# Patient Record
Sex: Female | Born: 2000 | Race: Black or African American | Hispanic: No | Marital: Single | Smoking: Never smoker
Health system: Southern US, Community
[De-identification: ages and names within clinical notes are randomized; demographics above are authoritative.]

## PROBLEM LIST (undated history)

## (undated) DIAGNOSIS — J45909 Unspecified asthma, uncomplicated: Secondary | ICD-10-CM

## (undated) DIAGNOSIS — R6521 Severe sepsis with septic shock: Secondary | ICD-10-CM

## (undated) DIAGNOSIS — A419 Sepsis, unspecified organism: Secondary | ICD-10-CM

## (undated) HISTORY — PX: EXTRACORPOREAL CIRCULATION: SHX266

## (undated) HISTORY — PX: CARDIAC SURGERY: SHX584

## (undated) HISTORY — PX: OTHER SURGICAL HISTORY: SHX169

## (undated) HISTORY — PX: WISDOM TOOTH EXTRACTION: SHX21

---
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
## 2019-02-23 ENCOUNTER — Ambulatory Visit: Payer: Medicaid Other | Admitting: Obstetrics and Gynecology

## 2019-02-23 ENCOUNTER — Encounter: Payer: Self-pay | Admitting: Obstetrics and Gynecology

## 2019-02-23 ENCOUNTER — Other Ambulatory Visit: Payer: Self-pay

## 2019-02-23 DIAGNOSIS — Z369 Encounter for antenatal screening, unspecified: Secondary | ICD-10-CM

## 2019-02-23 LAB — OB RESULTS CONSOLE VARICELLA ZOSTER ANTIBODY, IGG: Varicella: UNDETERMINED

## 2019-02-23 NOTE — Progress Notes (Signed)
Neysa Hotter presents for NOB nurse interview visit. Pregnancy confirmation done __12/11/2018 at [facility]. G1. P0. Pregnancy education material explained and given. 0 cats in home. NOB labs ordered. TSH/HbgA1c ordered due to BMI 30 or greater. Sickle cell ordered due to patient's race. HIV labs and drug screen were explained and ordered. PNV encouraged. Genetic screening options discussed. Genetic testing:Unsure. Patient may discuss with the provider. Patient to follow up with provider in _1-2___ weeks for NOB physical. All questions answered. Ready set baby has been gone over.

## 2019-02-25 ENCOUNTER — Other Ambulatory Visit: Payer: Medicaid Other

## 2019-02-25 LAB — VARICELLA ZOSTER ANTIBODY, IGG: Varicella zoster IgG: 137 index — ABNORMAL LOW (ref >165)

## 2019-02-25 LAB — HIV ANTIBODY (ROUTINE TESTING W REFLEX): HIV Screen 4th Generation wRfx: NONREACTIVE

## 2019-02-25 LAB — HEMOGLOBIN A1C
Est. average glucose Bld gHb Est-mCnc: 100 mg/dL
Hgb A1c MFr Bld: 5.1 % (ref 4.8–5.6)

## 2019-02-25 LAB — RPR: RPR Ser Ql: NONREACTIVE

## 2019-02-25 LAB — HGB SOLU + RFLX FRAC: Sickle Solubility Test - HGBRFX: NEGATIVE

## 2019-02-25 LAB — ABO AND RH: Rh Factor: POSITIVE

## 2019-02-25 LAB — RUBELLA SCREEN: Rubella Antibodies, IGG: 5.37 index (ref >0.99)

## 2019-02-25 LAB — HEPATITIS B SURFACE ANTIGEN: Hepatitis B Surface Ag: NEGATIVE

## 2019-02-25 LAB — ANTIBODY SCREEN: Antibody Screen: NEGATIVE

## 2019-02-25 LAB — TSH: TSH: 0.019 u[IU]/mL — ABNORMAL LOW (ref 0.450–4.500)

## 2019-02-26 NOTE — L&D Delivery Note (Signed)
Delivery Summary for Briana Cruz  Labor Events:   Preterm labor: No data found  Rupture date: 08/27/2019  Rupture time: 8:00 PM  Rupture type: Spontaneous  Fluid Color: Clear  Induction: No data found  Augmentation: No data found  Complications: No data found  Cervical ripening: No data found No data found   No data found     Delivery:   Episiotomy: No data found  Lacerations: No data found  Repair suture: No data found  Repair # of packets: No data found  Blood loss (ml): 820   Information for the patient's newborn:  Lissy, Deuser [[id number]]    Delivery 08/28/2019 4:40 AM by  Vaginal, Spontaneous Sex:  female Gestational Age: [redacted]w[redacted]d Delivery Clinician:   Living?:         APGARS  One minute Five minutes Ten minutes  Skin color:        Heart rate:        Grimace:        Muscle tone:        Breathing:        Totals: 8  9      Presentation/position:      Resuscitation:   Cord information:    Disposition of cord blood:     Blood gases sent?  Complications:   Placenta: Delivered:       appearance Newborn Measurements: Weight: 5 lb 3.3 oz (2360 g)  Height: 18.25"  Head circumference:    Chest circumference:    Other providers:    Additional  information: Forceps:   Vacuum:   Breech:   Observed anomalies mongolian spots buttocks; dimpled chin       Delivery Note At 4:40 AM a viable and healthy female was delivered via Vaginal, Spontaneous (Presentation: Vertex; Left Occiput Anterior).  APGAR: 8, 9; weight 5 lb 3.3 oz (2360 g).   Placenta status: Spontaneous, Intact.  Cord: 3 vessels with the following complications: None.  Cord pH: not obtained.    Anesthesia: Epidural Episiotomy: None Lacerations: None Suture Repair: None Est. Blood Loss (mL): 820. Cytotec 800 mcg administered PR. 1 dose of Methergine 0.2 mg IM administered.  Mom to postpartum.  Baby to Couplet care / Skin to Skin.   Hildred Laser, MD 08/27/2019,  5: 16 AM

## 2019-03-02 ENCOUNTER — Other Ambulatory Visit: Payer: Self-pay

## 2019-03-02 ENCOUNTER — Ambulatory Visit (INDEPENDENT_AMBULATORY_CARE_PROVIDER_SITE_OTHER): Payer: Self-pay

## 2019-03-02 DIAGNOSIS — Z369 Encounter for antenatal screening, unspecified: Secondary | ICD-10-CM

## 2019-03-02 DIAGNOSIS — Z3A11 11 weeks gestation of pregnancy: Secondary | ICD-10-CM

## 2019-03-02 DIAGNOSIS — N8312 Corpus luteum cyst of left ovary: Secondary | ICD-10-CM

## 2019-03-02 DIAGNOSIS — O3481 Maternal care for other abnormalities of pelvic organs, first trimester: Secondary | ICD-10-CM

## 2019-03-10 ENCOUNTER — Ambulatory Visit (INDEPENDENT_AMBULATORY_CARE_PROVIDER_SITE_OTHER): Payer: Medicaid Other | Admitting: Obstetrics and Gynecology

## 2019-03-10 ENCOUNTER — Encounter: Payer: Self-pay | Admitting: Obstetrics and Gynecology

## 2019-03-10 ENCOUNTER — Other Ambulatory Visit: Payer: Self-pay

## 2019-03-10 VITALS — BP 112/74 | HR 88 | Wt 240.5 lb

## 2019-03-10 DIAGNOSIS — E0590 Thyrotoxicosis, unspecified without thyrotoxic crisis or storm: Secondary | ICD-10-CM

## 2019-03-10 DIAGNOSIS — Z369 Encounter for antenatal screening, unspecified: Secondary | ICD-10-CM

## 2019-03-10 DIAGNOSIS — Z3A13 13 weeks gestation of pregnancy: Secondary | ICD-10-CM

## 2019-03-10 DIAGNOSIS — O99281 Endocrine, nutritional and metabolic diseases complicating pregnancy, first trimester: Secondary | ICD-10-CM

## 2019-03-10 DIAGNOSIS — Z86718 Personal history of other venous thrombosis and embolism: Secondary | ICD-10-CM

## 2019-03-10 DIAGNOSIS — Z9281 Personal history of extracorporeal membrane oxygenation (ECMO): Secondary | ICD-10-CM

## 2019-03-10 LAB — POCT URINALYSIS DIPSTICK OB
Bilirubin, UA: NEGATIVE
Glucose, UA: NEGATIVE
Nitrite, UA: NEGATIVE
Spec Grav, UA: 1.01 (ref 1.010–1.025)
Urobilinogen, UA: 0.2 E.U./dL
pH, UA: 6.5 (ref 5.0–8.0)

## 2019-03-10 MED ORDER — ENOXAPARIN SODIUM 40 MG/0.4ML ~~LOC~~ SOLN: 40.0000 mg | SUBCUTANEOUS | 0 refills | Status: DC

## 2019-03-10 NOTE — Progress Notes (Signed)
HPI:      [name] is a 19 y.o. G1P0 who LMP was Patient's last menstrual period was 12/04/2018.  Subjective:   She presents today to initiate prenatal care.  She has a number of medical issues. 3-1/2 years ago she underwent a procedure for wisdom tooth extraction and ended up with multiple face and tongue abscesses, cardiac issues requiring ECMO, subsequent blood clot issues requiring use of Lovenox, patient describes a complete loss of functioning and having to learn to speak and walk again afterward.  She states that she believes she is completely cleared cardiac wise and does not have a cardiologist at this time. She is taking prenatal vitamins. Screening labs reveal possibility of hyperthyroidism.    Hx: The following portions of the patient's history were reviewed and updated as appropriate:             She  has a past medical history of Asthma and Septic shock ([place]). She does not have a problem list on file. She  has a past surgical history that includes Extracorporeal membrane oxygenation; Cardiac surgery; Wisdom tooth extraction; and OTHER SURGICAL HISTORY. Her family history is not on file. She  reports that she has never smoked. She has never used smokeless tobacco. She reports that she does not drink alcohol or use drugs. She has a current medication list which includes the following prescription(s): prenatal multivitamin and enoxaparin. She is allergic to penicillins.       Review of Systems:  Review of Systems  Constitutional: Denied constitutional symptoms, night sweats, recent illness, fatigue, fever, insomnia and weight loss.  Eyes: Denied eye symptoms, eye pain, photophobia, vision change and visual disturbance.  Ears/Nose/Throat/Neck: Denied ear, nose, throat or neck symptoms, hearing loss, nasal discharge, sinus congestion and sore throat.  Cardiovascular: Denied cardiovascular symptoms, arrhythmia, chest pain/pressure, edema, exercise intolerance, orthopnea  and palpitations.  Respiratory: Denied pulmonary symptoms, asthma, pleuritic pain, productive sputum, cough, dyspnea and wheezing.  Gastrointestinal: Denied, gastro-esophageal reflux, melena, nausea and vomiting.  Genitourinary: Denied genitourinary symptoms including symptomatic vaginal discharge, pelvic relaxation issues, and urinary complaints.  Musculoskeletal: Denied musculoskeletal symptoms, stiffness, swelling, muscle weakness and myalgia.  Dermatologic: Denied dermatology symptoms, rash and scar.  Neurologic: Denied neurology symptoms, dizziness, headache, neck pain and syncope.  Psychiatric: Denied psychiatric symptoms, anxiety and depression.  Endocrine: Denied endocrine symptoms including hot flashes and night sweats.   Meds:   Current Outpatient Medications on File Prior to Visit  Medication Sig Dispense Refill  . Prenatal Vit-Fe Fumarate-FA (PRENATAL MULTIVITAMIN) TABS tablet Take 1 tablet by mouth daily at 12 noon.     No current facility-administered medications on file prior to visit.    Objective:     Vitals:   03/10/19 1342  BP: 112/74  Pulse: 88    She is probably Physical examination General NAD, Conversant  HEENT Atraumatic; Op clear with mmm.  Normo-cephalic. Pupils reactive. Anicteric sclerae  Thyroid/Neck Smooth without nodularity or enlargement. Normal ROM.  Neck Supple.  Skin No rashes, lesions or ulceration. Normal palpated skin turgor. No nodularity.  Breasts: No masses or discharge.  Symmetric.  No axillary adenopathy.  Lungs: Clear to auscultation.No rales or wheezes. Normal Respiratory effort, no retractions.  Heart: NSR.  No murmurs or rubs appreciated. No periferal edema  Abdomen: Soft.  Non-tender.  No masses.  No HSM. No hernia  Extremities: Moves all appropriately.  Normal ROM for age. No lymphadenopathy.  Neuro: Oriented to PPT.  Normal mood. Normal affect.  Pelvic:   Vulva: Normal appearance.  No lesions.  Vagina: No lesions or  abnormalities noted.  Support: Normal pelvic support.  Urethra No masses tenderness or scarring.  Meatus Normal size without lesions or prolapse.  Cervix: Normal appearance.  No lesions.  Anus: Normal exam.  No lesions.  Perineum: Normal exam.  No lesions.        Bimanual   Adnexae: No masses.  Non-tender to palpation.  Uterus: Enlarged. 13wks  Non-tender.  Mobile.  AV.  Adnexae: No masses.  Non-tender to palpation.  Cul-de-sac: Negative for abnormality.  Adnexae: No masses.  Non-tender to palpation.         Pelvimetry   Diagonal: Reached.  Spines: Average.  Sacrum: Concave.  Pubic Arch: Normal.      Assessment:    G1P0 There are no problems to display for this patient.    1. Prenatal screening encounter   2. Hyperthyroidism   3. History of blood clots   4. Personal history of ECMO        Plan:            Prenatal Plan 1.  Begin Lovenox for anticoagulation prophylaxis 2.  Anesthesia consult regarding cardiac history and mouth facial fasciitis issues 3.  Continue prenatal vitamins 4.  Patient desires maternity 21 today 5.  Thyroid panel 6.  Patient to obtain cardiac clearance from her cardiologist in light of her previous ECMO   Orders Orders Placed This Encounter  Procedures  . MaterniT21 PLUS Core+ESS+SCA  . Thyroid Panel With TSH  . POC Urinalysis Dipstick OB     Meds ordered this encounter  Medications  . enoxaparin (LOVENOX) 40 MG/0.4ML injection    Sig: Inject 0.4 mLs (40 mg total) into the skin daily.    Dispense:  36 mL    Refill:  0      F/U  Return in about 4 weeks (around 04/07/2019) for [facility]. I spent 35 minutes involved in the care of this patient preparing to see the patient by obtaining and reviewing her medical history (including labs, imaging tests and prior procedures), documenting clinical information in the electronic health record (EHR), writing and sending prescriptions, ordering tests or procedures and directly communicating  with the patient discussing pertinent items from her history and physical exam as well as my assessment and plan as noted above.  All of her questions were answered.  Elonda Husky, M.D. 03/10/2019 2:17 PM

## 2019-03-11 LAB — THYROID PANEL WITH TSH
Free Thyroxine Index: 2.6 (ref 1.2–4.9)
T3 Uptake Ratio: 23 % — ABNORMAL LOW (ref 24–39)
T4, Total: 11.3 ug/dL (ref 4.5–12.0)
TSH: 0.137 u[IU]/mL — ABNORMAL LOW (ref 0.450–4.500)

## 2019-03-15 LAB — MATERNIT21  PLUS CORE+ESS+SCA, BLOOD

## 2019-03-26 ENCOUNTER — Telehealth: Payer: Self-pay

## 2019-03-26 NOTE — Telephone Encounter (Signed)
Pt called no answer LM via VM to call the office to speak more about her concerns for calling the office.  

## 2019-03-26 NOTE — Telephone Encounter (Signed)
OB -2nd trimester  Small amount of blood on TP this morning. NO pelvic pain.  POS FM.  Pls advise

## 2019-04-07 ENCOUNTER — Ambulatory Visit (INDEPENDENT_AMBULATORY_CARE_PROVIDER_SITE_OTHER): Payer: Medicaid Other | Admitting: Obstetrics and Gynecology

## 2019-04-07 ENCOUNTER — Encounter: Payer: Self-pay | Admitting: Obstetrics and Gynecology

## 2019-04-07 ENCOUNTER — Other Ambulatory Visit: Payer: Self-pay

## 2019-04-07 VITALS — BP 91/59 | HR 105 | Ht 63.0 in | Wt 240.7 lb

## 2019-04-07 DIAGNOSIS — O99210 Obesity complicating pregnancy, unspecified trimester: Secondary | ICD-10-CM

## 2019-04-07 DIAGNOSIS — O09899 Supervision of other high risk pregnancies, unspecified trimester: Secondary | ICD-10-CM

## 2019-04-07 DIAGNOSIS — Z283 Underimmunization status: Secondary | ICD-10-CM

## 2019-04-07 DIAGNOSIS — O0992 Supervision of high risk pregnancy, unspecified, second trimester: Secondary | ICD-10-CM

## 2019-04-07 DIAGNOSIS — Z6841 Body Mass Index (BMI) 40.0 and over, adult: Secondary | ICD-10-CM

## 2019-04-07 DIAGNOSIS — Z86718 Personal history of other venous thrombosis and embolism: Secondary | ICD-10-CM

## 2019-04-07 DIAGNOSIS — R946 Abnormal results of thyroid function studies: Secondary | ICD-10-CM

## 2019-04-07 DIAGNOSIS — Z9281 Personal history of extracorporeal membrane oxygenation (ECMO): Secondary | ICD-10-CM

## 2019-04-07 HISTORY — DX: Morbid (severe) obesity due to excess calories: E66.01

## 2019-04-07 HISTORY — DX: Body Mass Index (BMI) 40.0 and over, adult: Z684

## 2019-04-07 LAB — POCT URINALYSIS DIPSTICK OB
Bilirubin, UA: NEGATIVE
Blood, UA: NEGATIVE
Glucose, UA: NEGATIVE
Ketones, UA: NEGATIVE
Nitrite, UA: NEGATIVE
Spec Grav, UA: 1.015 (ref 1.010–1.025)
Urobilinogen, UA: 0.2 E.U./dL
pH, UA: 7.5 (ref 5.0–8.0)

## 2019-04-07 NOTE — Patient Instructions (Signed)

## 2019-04-07 NOTE — Progress Notes (Signed)
ROB: Patient denies complaints. Mom present for today's visit as they have questions regarding current plan of care and diagnoses.  Answered all questions regarding patient's initiation of Lovenox for DVT prophylaxis due to her h/o blood clot, need for Cardiology consult due to h/o ECMO, need for Anesthesia consult, and recent abnormal thyroid levels.  Thyroid levels starting to improve, may pregnancy hormone related. Will recheck again at 20 weeks. S/p normal MaterniT21, will have AFP performed next visit. RTC in 4 weeks, for anatomy scan at that time. Declines flu vaccine. Is varicella non-immune, will need vaccination postpartum.

## 2019-04-07 NOTE — Progress Notes (Signed)
ROB-Pt present for routine prenatal care. Pt declined flu vaccine. Pt stated having lower back pain.

## 2019-04-30 ENCOUNTER — Telehealth: Payer: Self-pay | Admitting: Obstetrics and Gynecology

## 2019-04-30 NOTE — Telephone Encounter (Signed)
Pts mother called in and stated that she is being reckless and that she wants someone to talk to the pt. The pts mom worried about the baby, and the mom is requesting a call back. Please advise.

## 2019-05-03 NOTE — Telephone Encounter (Signed)
Spoke with patients mother about her concerns for her daughter. Mother is on [place]. Mother is concerned that daughter is depressed. Mother stated that daughter has been through a lot. She is concerned that daughter is not taking care of herself or the baby. She is not sure if daughter is still taking her Lovenox injections. I told her that I can give her a depression screening when she comes in for appointment. Mother does not want her daughter to know that she called.

## 2019-05-05 ENCOUNTER — Other Ambulatory Visit: Payer: Medicaid Other

## 2019-05-05 ENCOUNTER — Ambulatory Visit (INDEPENDENT_AMBULATORY_CARE_PROVIDER_SITE_OTHER): Payer: Medicaid Other | Admitting: Obstetrics and Gynecology

## 2019-05-05 ENCOUNTER — Other Ambulatory Visit: Payer: Self-pay

## 2019-05-05 ENCOUNTER — Encounter: Payer: Self-pay | Admitting: Obstetrics and Gynecology

## 2019-05-05 ENCOUNTER — Ambulatory Visit (INDEPENDENT_AMBULATORY_CARE_PROVIDER_SITE_OTHER): Payer: Medicaid Other

## 2019-05-05 ENCOUNTER — Encounter: Payer: Medicaid Other | Admitting: Obstetrics and Gynecology

## 2019-05-05 VITALS — BP 107/70 | HR 90 | Wt 249.2 lb

## 2019-05-05 DIAGNOSIS — Z3A20 20 weeks gestation of pregnancy: Secondary | ICD-10-CM

## 2019-05-05 DIAGNOSIS — O0992 Supervision of high risk pregnancy, unspecified, second trimester: Secondary | ICD-10-CM

## 2019-05-05 DIAGNOSIS — Z363 Encounter for antenatal screening for malformations: Secondary | ICD-10-CM

## 2019-05-05 DIAGNOSIS — O99212 Obesity complicating pregnancy, second trimester: Secondary | ICD-10-CM | POA: Diagnosis not present

## 2019-05-05 DIAGNOSIS — Z9281 Personal history of extracorporeal membrane oxygenation (ECMO): Secondary | ICD-10-CM

## 2019-05-05 LAB — POCT URINALYSIS DIPSTICK OB
Bilirubin, UA: NEGATIVE
Blood, UA: NEGATIVE
Glucose, UA: NEGATIVE
Ketones, UA: NEGATIVE
Nitrite, UA: NEGATIVE
Spec Grav, UA: 1.01 (ref 1.010–1.025)
Urobilinogen, UA: 0.2 E.U./dL
pH, UA: 7 (ref 5.0–8.0)

## 2019-05-05 NOTE — Progress Notes (Signed)
ROB: Thyroid panel drawn today.  Patient did not return cardiology phone calls for consult.  I have stressed the importance of this and given her the name and number of the cardiologist to contact for scheduling appointments.  She states that she is using Lovenox as directed.  Anesthesia consult ordered.  FAS ultrasound today but limited by patient body habitus.  Patient will have to return.  Reports feeling fetal movement.

## 2019-05-07 LAB — AFP, SERUM, OPEN SPINA BIFIDA
AFP MoM: 1.87
AFP Value: 89.5 ng/mL
Gest. Age on Collection Date: 20.1 weeks
Maternal Age At EDD: 20.7 yr
OSBR Risk 1 IN: 2166
Test Results:: NEGATIVE
Weight: 240 [lb_av]

## 2019-05-07 LAB — THYROID PANEL WITH TSH
Free Thyroxine Index: 1.8 (ref 1.2–4.9)
T3 Uptake Ratio: 13 % — ABNORMAL LOW (ref 24–39)
T4, Total: 13.7 ug/dL — ABNORMAL HIGH (ref 4.5–12.0)
TSH: 1.54 u[IU]/mL (ref 0.450–4.500)

## 2019-05-11 ENCOUNTER — Ambulatory Visit: Payer: Self-pay | Admitting: Cardiovascular Disease

## 2019-05-14 ENCOUNTER — Other Ambulatory Visit: Payer: Self-pay | Admitting: Obstetrics and Gynecology

## 2019-05-14 DIAGNOSIS — Z09 Encounter for follow-up examination after completed treatment for conditions other than malignant neoplasm: Secondary | ICD-10-CM

## 2019-05-18 ENCOUNTER — Ambulatory Visit (INDEPENDENT_AMBULATORY_CARE_PROVIDER_SITE_OTHER): Payer: Medicaid Other | Admitting: Cardiovascular Disease

## 2019-05-18 ENCOUNTER — Encounter: Payer: Self-pay | Admitting: Cardiovascular Disease

## 2019-05-18 ENCOUNTER — Other Ambulatory Visit: Payer: Self-pay

## 2019-05-18 VITALS — BP 110/72 | HR 100 | Ht 64.0 in | Wt 254.4 lb

## 2019-05-18 DIAGNOSIS — Z9281 Personal history of extracorporeal membrane oxygenation (ECMO): Secondary | ICD-10-CM

## 2019-05-18 DIAGNOSIS — Z86711 Personal history of pulmonary embolism: Secondary | ICD-10-CM

## 2019-05-18 DIAGNOSIS — Z86718 Personal history of other venous thrombosis and embolism: Secondary | ICD-10-CM | POA: Diagnosis not present

## 2019-05-18 DIAGNOSIS — Z3A22 22 weeks gestation of pregnancy: Secondary | ICD-10-CM

## 2019-05-18 NOTE — Progress Notes (Signed)
Cardiology Office Note  Date:  05/18/2019   ID:  Briana Cruz, DOB 2000/09/11, MRN [id number]  [facility]P:  [facility] [place]   Chief Complaint  Patient presents with  . New Patient (Initial Visit)    Ref by Dr. Jeannie Cruz ob/gyn for evaluation for anesthesia consult & a history of blood clots; pt. is [redacted] weeks pregnant. Meds reviewed by the pt. verbally. 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  DVT and PE complications Discharge from hospital June 15 2014 and was in the hospital 46 days Who presents by referral from Dr. Amalia Cruz for consultation of her cardiac issues, use of anticoagulation  Reports that she is [redacted] weeks pregnant Started On lovenox , started by Dr. Marcelline Cruz given prior history of DVT .  She is on low-dose prophylactic dosing  Over the past 4 to 5 years has been active, no recurrent DVT or PE Did not have any thromboembolic issues prior to 8416 Discussed details with her mother on the phone today  Currently works , [employer] On her feet for most of the shift, no problems with swelling, shortness of breath or chest discomfort  Echo 04/2014: normal study Results pulled up and reviewed  Lab work reviewed, normal renal function  EKG personally reviewed by myself on todays visit Shows normal sinus rhythm rate 100 bpm no significant ST or T wave changes   PMH:   has a past medical history of Asthma and Septic shock ([place]).  PSH:    Past Surgical History:  Procedure Laterality Date  . CARDIAC SURGERY    . EXTRACORPOREAL CIRCULATION    . OTHER SURGICAL HISTORY     Facial drain  . WISDOM TOOTH EXTRACTION      Current Outpatient Medications  Medication Sig Dispense Refill  . albuterol (PROAIR HFA) 108 (90 Base) MCG/ACT inhaler Inhale 1-2 puffs into the lungs every 4 (four) hours as  needed.     . enoxaparin (LOVENOX) 40 MG/0.4ML injection Inject 0.4 mLs (40 mg total) into the skin daily. 36 mL 0  . Prenatal Vit-Fe Fumarate-FA (PRENATAL MULTIVITAMIN) TABS tablet Take 1 tablet by mouth daily at 12 noon.     No current facility-administered medications for this visit.     Allergies:   Penicillins   Social History:  The patient  reports that she has never smoked. She has never used smokeless tobacco. She reports previous drug use. Drug: Marijuana. She reports that she does not drink alcohol.   Family History:   family history includes Congestive Heart Failure in her maternal grandmother; Diabetes in her maternal grandfather and maternal grandmother; Gout in her maternal grandfather; Healthy in her brother, father, and mother; Hypertension in her maternal grandmother; Kidney disease in her maternal grandmother.    Review of Systems: Review of Systems  Constitutional: Negative.   HENT: Negative.   Respiratory: Negative.   Cardiovascular: Negative.   Gastrointestinal: Negative.   Musculoskeletal: Negative.   Neurological: Negative.   Psychiatric/Behavioral: Negative.   All other systems reviewed and are negative.    PHYSICAL EXAM: VS:  BP 110/72 (BP Location: Right Arm, Patient Position: Sitting, Cuff Size: Normal)   Pulse 100   Ht 5\' 4"  (1.626 m)   Wt 254 lb 6 oz (115.4 kg)   LMP 12/04/2018   SpO2 98%  BMI 43.66 kg/m  , BMI Body mass index is 43.66 kg/m. GEN: Well nourished, well developed, in no acute distress obese HEENT: normal Neck: no JVD, carotid bruits, or masses Cardiac: RRR; no murmurs, rubs, or gallops,no edema  Respiratory:  clear to auscultation bilaterally, normal work of breathing GI: soft, nontender, nondistended, + BS MS: no deformity or atrophy Skin: warm and dry, no rash, well-healed mediastinal scar Neuro:  Strength and sensation are intact Psych: euthymic mood, full affect   Recent Labs: 05/05/2019: TSH 1.540    Lipid  Panel No results found for: CHOL, HDL, LDLCALC, TRIG    Wt Readings from Last 3 Encounters:  05/18/19 254 lb 6 oz (115.4 kg)  05/05/19 249 lb 3.2 oz (113 kg)  04/07/19 240 lb 11.2 oz (109.2 kg)       ASSESSMENT AND PLAN:  Problem List Items Addressed This Visit    History of blood clots - Primary   Relevant Orders   EKG 12-Lead   Personal history of ECMO    Other Visit Diagnoses    History of pulmonary embolism       [redacted] weeks gestation of pregnancy       History of DVT (deep vein thrombosis)         Complicated hospital course and 2015 extending into 2016 Essentially bedridden, fighting infection in the hospital 46 days ECMO required at that time Developed DVT, PE, treated with Lovenox, stopped in summer 2016 Has not had any recurrence of her thromboembolic disease No kidney issues,  Has been active, no significant medical issues past 4 to 5 years following recovery -Prior cardiac studies reviewed including echocardiogram 2016, normal March 2016 -No further cardiac work-up needed -OB/GYN has started prophylactic dose Lovenox given prior history She is not sedentary, works full-time job at [employer] place, on her feet for long hours at a time -No further cardiac work-up needed Lab work reviewed from 2015 and 2016, on my review, there was little concern for genetic predisposition for thromboembolic disease No further work-up needed  Disposition:   F/U as needed   Total encounter time more than 60 minutes  Greater than 50% was spent in counseling and coordination of care with the patient    Signed, Briana Cruz, M.D., Ph.D. [facility] [place], [place] [phone]

## 2019-05-18 NOTE — Patient Instructions (Signed)

## 2019-05-19 ENCOUNTER — Ambulatory Visit (INDEPENDENT_AMBULATORY_CARE_PROVIDER_SITE_OTHER): Payer: Medicaid Other

## 2019-05-19 DIAGNOSIS — Z3A22 22 weeks gestation of pregnancy: Secondary | ICD-10-CM

## 2019-05-19 DIAGNOSIS — Z3689 Encounter for other specified antenatal screening: Secondary | ICD-10-CM | POA: Diagnosis not present

## 2019-05-19 DIAGNOSIS — Z09 Encounter for follow-up examination after completed treatment for conditions other than malignant neoplasm: Secondary | ICD-10-CM

## 2019-05-20 ENCOUNTER — Encounter: Payer: Self-pay | Admitting: Cardiovascular Disease

## 2019-05-23 ENCOUNTER — Other Ambulatory Visit: Payer: Self-pay | Admitting: Obstetrics and Gynecology

## 2019-05-23 DIAGNOSIS — Z86718 Personal history of other venous thrombosis and embolism: Secondary | ICD-10-CM

## 2019-05-24 NOTE — Telephone Encounter (Signed)
Please advise on refill.

## 2019-06-02 ENCOUNTER — Encounter: Payer: Self-pay | Admitting: Obstetrics and Gynecology

## 2019-06-02 ENCOUNTER — Other Ambulatory Visit: Payer: Self-pay

## 2019-06-02 ENCOUNTER — Ambulatory Visit (INDEPENDENT_AMBULATORY_CARE_PROVIDER_SITE_OTHER): Payer: Medicaid Other | Admitting: Obstetrics and Gynecology

## 2019-06-02 VITALS — BP 96/66 | HR 103 | Wt 254.4 lb

## 2019-06-02 DIAGNOSIS — Z130 Encounter for screening for diseases of the blood and blood-forming organs and certain disorders involving the immune mechanism: Secondary | ICD-10-CM

## 2019-06-02 DIAGNOSIS — O0992 Supervision of high risk pregnancy, unspecified, second trimester: Secondary | ICD-10-CM

## 2019-06-02 DIAGNOSIS — Z3A24 24 weeks gestation of pregnancy: Secondary | ICD-10-CM | POA: Diagnosis not present

## 2019-06-02 DIAGNOSIS — Z86718 Personal history of other venous thrombosis and embolism: Secondary | ICD-10-CM

## 2019-06-02 DIAGNOSIS — Z131 Encounter for screening for diabetes mellitus: Secondary | ICD-10-CM

## 2019-06-02 LAB — POCT URINALYSIS DIPSTICK OB
Bilirubin, UA: NEGATIVE
Blood, UA: NEGATIVE
Glucose, UA: NEGATIVE
Ketones, UA: NEGATIVE
Nitrite, UA: NEGATIVE
POC,PROTEIN,UA: NEGATIVE
Spec Grav, UA: 1.01 (ref 1.010–1.025)
Urobilinogen, UA: 0.2 E.U./dL
pH, UA: 7.5 (ref 5.0–8.0)

## 2019-06-02 MED ORDER — ENOXAPARIN SODIUM 40 MG/0.4ML ~~LOC~~ SOLN: SUBCUTANEOUS | 3 refills | Status: DC

## 2019-06-02 MED ORDER — ALBUTEROL SULFATE HFA 108 (90 BASE) MCG/ACT IN AERS: 1.0000 | INHALATION_SPRAY | RESPIRATORY_TRACT | 1 refills | Status: DC | PRN

## 2019-06-02 NOTE — Patient Instructions (Signed)
GESTATIONAL DIABETES TESTING FOR PREGNANCY  Pregnant women can develop a condition known as Gestational Diabetes (diabetes brought on by pregnancy) which can pose a risk to both mother and baby. A glucose tolerance test is a common type of testing for potential gestational diabetes.  There are several tests intended to identify gestational diabetes in pregnant women. The first, called the Glucose Challenge Screening, is a preliminary screening test performed between 26-28 weeks. If a woman tests positive during this screening test, the second test, called the Glucose Tolerance Test, may be performed. This test will diagnose whether diabetes exists or not by indicating whether or not the body is using glucose (a type of sugar) effectively.  The Glucose Challenge Screening is now considered to be a standard test performed during the early part of the third trimester of pregnancy.  What is the Glucose Challenge Screening Test? No preparation is required prior to the test. During the test, the mother is asked to drink a sweet liquid (glucose) and then will have blood drawn one hour from having the drink, as blood glucose levels normally peak within one hour. No fasting is required prior to this test.  The test evaluates how your body processes sugar. A high level in your blood may indicate your body is not processing sugar effectively (positive test). If the results of this screen are positive, the woman may have the Glucose Tolerance Test performed. It is important to note that not all women who test positive for the Glucose Challenge Screening test are found to have diabetes upon further diagnosis.  What is the Glucose Tolerance Test? Prior to the taking the glucose tolerance test, your doctor will ask you to make sure and eat at least 150mg of carbohydrates (about what you will get from a slice or two of bread) for three days prior to the time you will be asked to fast. You will not be permitted to eat  or drink anything but sips of water for 14 hours prior to the test, so it is best to schedule the test for first thing in the morning.  Additionally, you should plan to have someone drive you to and from the test since your energy levels may be low and there is a slight possibility you may feel light-headed.  When you arrive, the technician will draw blood to measure your baseline "fasting blood glucose level". You will be asked to drink a larger volume (or more concentrated solution) of the glucose drink than was used in the initial Glucose Challenge Screening test. Your blood will be drawn and tested every hour for the next three hours.  The following are the values that the American Diabetes Association considers to be abnormal during the Glucose Tolerance Test:  Interval Abnormal reading Fasting 95 mg/dl or higher One hour 180 mg/dl or higher Two hours 155 mg/dl or higher Three hours 140 mg/dl or higher  What if my Glucose Tolerance Test Results are Abnormal? If only one of your readings comes back abnormal, your doctor may suggest some changes to your diet and/or test you again later in the pregnancy. If two or more of your readings come back abnormal, you'll be diagnosed with Gestational Diabetes and your doctor or midwife will talk to you about a treatment plan. Treating diabetes during pregnancy is extremely important to protect the health of both mother and baby.   Compiled using information from the following sources:  1. American Diabetes Association  https://www.diabetes.org  2. Emedicine  https://www.emedicine.com    3. National Institute of Diabetes and Digestive and Kidney Diseases  

## 2019-06-02 NOTE — Progress Notes (Signed)
ROB-Pt present for routine prenatal care. Pt stated that she was doing well no problems.  

## 2019-06-02 NOTE — Progress Notes (Signed)
ROB: Patient denies complaints today. Requests refill on Lovenox, also desires refill of Albuterol inhaler (notes no worsening of symptoms, just uses it to help if she has to walk a lot). S/p Cardiology consult, no further workup needed. Still needs Anesthesia consult. RTC in 4 weeks, for 28 week labs at that time.    The following were addressed during this visit:  Breastfeeding Education - Early initiation of breastfeeding    Comments: Keeps milk supply adequate, helps contract uterus and slow bleeding, and early milk is the perfect first food and is easy to digest.   - The importance of exclusive breastfeeding    Comments: Provides antibodies, Lower risk of breast and ovarian cancers, and type-2 diabetes,Helps your body recover, Reduced chance of SIDS.   - Risks of giving your baby anything other than breast milk if you are breastfeeding    Comments: Make the baby less content with breastfeeds, may make my baby more susceptible to illness, and may reduce my milk supply.   - The importance of early skin-to-skin contact    Comments: Keeps baby warm and secure, helps keep baby's blood sugar up and breathing steady, easier to bond and breastfeed, and helps calm baby.  - Exclusive breastfeeding for the first 6 months    Comments: Builds a healthy milk supply and keeps it up, protects baby from sickness and disease, and breastmilk has everything your baby needs for the first 6 months.

## 2019-06-29 ENCOUNTER — Encounter
Admission: RE | Admit: 2019-06-29 | Discharge: 2019-06-29 | Disposition: A | Discharge status: Home / Self Care | Payer: Medicaid Other | Source: Ambulatory Visit | Attending: Anesthesiology | Admitting: Anesthesiology

## 2019-06-29 ENCOUNTER — Other Ambulatory Visit: Payer: Self-pay

## 2019-06-29 NOTE — Consult Note (Signed)
[facility] Anesthesia Consultation  Briana Cruz KNL:[id number] DOB: 2000-07-17 DOA: 06/29/2019 PCP: [facility]   Requesting physician: Dr. Valentino Saxon Date of consultation: 06/29/19 Reason for consultation: Hx of chest infection requiring ECMO, hx of DVT  CHIEF COMPLAINT:  Obesity during pregnancy  HISTORY OF PRESENT ILLNESS: Briana Cruz  is a 18 y.o. female with a known history of chest infection at age 58 following wisdom tooth extraction, she was on ECMO at that time. No cardiac problems at this time. Has followed up with cardiology during her pregnancy with no concerns. Most recent echo was normal. She also has a hx of DVT and is currently on lovenox. She does have mild intermittent asthma. Denies personal or family hx of bleeding disorders.   PAST MEDICAL HISTORY:   Past Medical History:  Diagnosis Date  . Asthma   . Septic shock (HCC)     PAST SURGICAL HISTORY:  Past Surgical History:  Procedure Laterality Date  . CARDIAC SURGERY    . EXTRACORPOREAL CIRCULATION    . OTHER SURGICAL HISTORY     Facial drain  . WISDOM TOOTH EXTRACTION      SOCIAL HISTORY:  Social History   Tobacco Use  . Smoking status: Never Smoker  . Smokeless tobacco: Never Used  Substance Use Topics  . Alcohol use: No    FAMILY HISTORY:  Family History  Problem Relation Age of Onset  . Healthy Mother   . Healthy Father   . Healthy Brother   . Congestive Heart Failure Maternal Grandmother   . Kidney disease Maternal Grandmother   . Diabetes Maternal Grandmother   . Hypertension Maternal Grandmother   . Diabetes Maternal Grandfather   . Gout Maternal Grandfather     DRUG ALLERGIES:  Allergies  Allergen Reactions  . Penicillins     REVIEW OF SYSTEMS:   RESPIRATORY: No cough, shortness of breath, wheezing.  CARDIOVASCULAR: No chest pain, orthopnea, edema.  HEMATOLOGY: No anemia, easy bruising or bleeding SKIN: No rash or  lesion. NEUROLOGIC: No tingling, numbness, weakness.  PSYCHIATRY: No anxiety or depression.   MEDICATIONS AT HOME:  Prior to Admission medications   Medication Sig Start Date End Date Taking? Authorizing Provider  albuterol (PROAIR HFA) 108 (90 Base) MCG/ACT inhaler Inhale 1-2 puffs into the lungs every 4 (four) hours as needed. 06/02/19   Hildred Laser, MD  enoxaparin (LOVENOX) 40 MG/0.4ML injection INJECT 0.4 ML UNDER THE SKIN ONCE DAILY 06/02/19   Hildred Laser, MD  Prenatal Vit-Fe Fumarate-FA (PRENATAL MULTIVITAMIN) TABS tablet Take 1 tablet by mouth daily at 12 noon.    [provider]      PHYSICAL EXAMINATION:   VITAL SIGNS: Last menstrual period 12/04/2018.  GENERAL:  19 y.o.-year-old patient no acute distress.  HEENT: Head atraumatic, normocephalic.  EXTREMITIES: No pedal edema, cyanosis, or clubbing.  NEUROLOGIC: normal gait PSYCHIATRIC: The patient is alert and oriented x 3.  SKIN: No obvious rash, lesion, or ulcer.    IMPRESSION AND PLAN:   Briana Cruz  is a 19 y.o. female presenting with hx of chest infection requiring ECMO and hx of DVT currently on lovenox.   No concerns from a cardiac standpoint.   Discussed with patient that she will likely be transitioned to heparin at some point during pregnancy. Discussed that use of anticoagulants does put some constraints on timing of epidural placement. Plan to followup with her OB provider to discuss what dose of heparin is planned so that both the  patient and the anesthesia care team can have a plan in place for timing of epidural placement.

## 2019-06-30 ENCOUNTER — Encounter: Payer: Medicaid Other | Admitting: Obstetrics and Gynecology

## 2019-06-30 ENCOUNTER — Other Ambulatory Visit: Payer: Medicaid Other

## 2019-07-02 ENCOUNTER — Other Ambulatory Visit: Payer: Medicaid Other

## 2019-07-02 ENCOUNTER — Encounter: Payer: Medicaid Other | Admitting: Obstetrics and Gynecology

## 2019-07-06 ENCOUNTER — Other Ambulatory Visit: Payer: Self-pay

## 2019-07-06 ENCOUNTER — Other Ambulatory Visit: Payer: Medicaid Other

## 2019-07-06 ENCOUNTER — Encounter: Payer: Self-pay | Admitting: Obstetrics and Gynecology

## 2019-07-06 ENCOUNTER — Ambulatory Visit (INDEPENDENT_AMBULATORY_CARE_PROVIDER_SITE_OTHER): Payer: Medicaid Other | Admitting: Obstetrics and Gynecology

## 2019-07-06 VITALS — BP 102/70 | HR 90 | Wt 251.5 lb

## 2019-07-06 DIAGNOSIS — Z131 Encounter for screening for diabetes mellitus: Secondary | ICD-10-CM

## 2019-07-06 DIAGNOSIS — Z130 Encounter for screening for diseases of the blood and blood-forming organs and certain disorders involving the immune mechanism: Secondary | ICD-10-CM

## 2019-07-06 DIAGNOSIS — Z3A29 29 weeks gestation of pregnancy: Secondary | ICD-10-CM

## 2019-07-06 DIAGNOSIS — O0992 Supervision of high risk pregnancy, unspecified, second trimester: Secondary | ICD-10-CM

## 2019-07-06 DIAGNOSIS — Z86718 Personal history of other venous thrombosis and embolism: Secondary | ICD-10-CM

## 2019-07-06 DIAGNOSIS — O0993 Supervision of high risk pregnancy, unspecified, third trimester: Secondary | ICD-10-CM | POA: Diagnosis not present

## 2019-07-06 DIAGNOSIS — Z23 Encounter for immunization: Secondary | ICD-10-CM | POA: Diagnosis not present

## 2019-07-06 LAB — POCT URINALYSIS DIPSTICK OB
Bilirubin, UA: NEGATIVE
Blood, UA: NEGATIVE
Glucose, UA: NEGATIVE
Leukocytes, UA: NEGATIVE
Nitrite, UA: NEGATIVE
Spec Grav, UA: 1.01 (ref 1.010–1.025)
Urobilinogen, UA: 0.2 E.U./dL
pH, UA: 7 (ref 5.0–8.0)

## 2019-07-06 NOTE — Progress Notes (Signed)
ROB: No complaints.  Reports very active baby.  Anesthesia consult discussed and reviewed with patient in light of cardiology consult.  Dr. Valentino Saxon and Dr. Logan Bores to put together a plan regarding timing of delivery and use of Lovenox etc.  1 hour GCT today.

## 2019-07-06 NOTE — Addendum Note (Signed)
Addended by: Dorian Pod on: 07/06/2019 03:18 PM   Modules accepted: Orders

## 2019-07-06 NOTE — Addendum Note (Signed)
Addended by: Dorian Pod on: 07/06/2019 09:08 AM   Modules accepted: Orders

## 2019-07-07 LAB — CBC
Hematocrit: 31.2 % — ABNORMAL LOW (ref 34.0–46.6)
Hemoglobin: 10.6 g/dL — ABNORMAL LOW (ref 11.1–15.9)
MCH: 30.8 pg (ref 26.6–33.0)
MCHC: 34 g/dL (ref 31.5–35.7)
MCV: 91 fL (ref 79–97)
Platelets: 207 10*3/uL (ref 150–450)
RBC: 3.44 x10E6/uL — ABNORMAL LOW (ref 3.77–5.28)
RDW: 12.5 % (ref 11.7–15.4)
WBC: 8 10*3/uL (ref 3.4–10.8)

## 2019-07-07 LAB — GLUCOSE, 1 HOUR GESTATIONAL: Gestational Diabetes Screen: 114 mg/dL (ref 65–139)

## 2019-07-07 LAB — RPR: RPR Ser Ql: NONREACTIVE

## 2019-07-08 ENCOUNTER — Other Ambulatory Visit: Payer: Self-pay | Admitting: Obstetrics and Gynecology

## 2019-07-08 MED ORDER — FERROUS SULFATE 325 (65 FE) MG PO TABS: 325.0000 mg | ORAL_TABLET | Freq: Every day | ORAL | 1 refills | Status: DC

## 2019-07-13 ENCOUNTER — Telehealth: Payer: Self-pay | Admitting: Obstetrics and Gynecology

## 2019-07-13 NOTE — Telephone Encounter (Signed)
Patient says medication that was prescribed to her is making her sick. Asked patient what medication it would be and she didn't remember the name of it. Could you please advise?

## 2019-07-14 NOTE — Telephone Encounter (Signed)
LM for patient to return call.

## 2019-07-16 NOTE — Telephone Encounter (Signed)
LM for patient to return call.

## 2019-07-16 NOTE — Telephone Encounter (Signed)
Pt was returning your call. Please advise

## 2019-07-16 NOTE — Telephone Encounter (Signed)
Spoke with patient and she has gotten sick two times when she took her iron tablet. I told her to try taking them with food to see if that helps. She will call back if she has any problems.

## 2019-07-27 NOTE — Patient Instructions (Signed)
Third Trimester of Pregnancy  The third trimester is from week 28 through week 40 (months 7 through 9). This trimester is when your unborn baby (fetus) is growing very fast. At the end of the ninth month, the unborn baby is about 20 inches in length. It weighs about 6-10 pounds. Follow these instructions at home: Medicines  Take over-the-counter and prescription medicines only as told by your doctor. Some medicines are safe and some medicines are not safe during pregnancy.  Take a prenatal vitamin that contains at least 600 micrograms (mcg) of folic acid.  If you have trouble pooping (constipation), take medicine that will make your stool soft (stool softener) if your doctor approves. Eating and drinking   Eat regular, healthy meals.  Avoid raw meat and uncooked cheese.  If you get low calcium from the food you eat, talk to your doctor about taking a daily calcium supplement.  Eat four or five small meals rather than three large meals a day.  Avoid foods that are high in fat and sugars, such as fried and sweet foods.  To prevent constipation: ? Eat foods that are high in fiber, like fresh fruits and vegetables, whole grains, and beans. ? Drink enough fluids to keep your pee (urine) clear or pale yellow. Activity  Exercise only as told by your doctor. Stop exercising if you start to have cramps.  Avoid heavy lifting, wear low heels, and sit up straight.  Do not exercise if it is too hot, too humid, or if you are in a place of great height (high altitude).  You may continue to have sex unless your doctor tells you not to. Relieving pain and discomfort  Wear a good support bra if your breasts are tender.  Take frequent breaks and rest with your legs raised if you have leg cramps or low back pain.  Take warm water baths (sitz baths) to soothe pain or discomfort caused by hemorrhoids. Use hemorrhoid cream if your doctor approves.  If you develop puffy, bulging veins (varicose  veins) in your legs: ? Wear support hose or compression stockings as told by your doctor. ? Raise (elevate) your feet for 15 minutes, 3-4 times a day. ? Limit salt in your food. Safety  Wear your seat belt when driving.  Make a list of emergency phone numbers, including numbers for family, friends, the hospital, and police and fire departments. Preparing for your baby's arrival To prepare for the arrival of your baby:  Take prenatal classes.  Practice driving to the hospital.  Visit the hospital and tour the maternity area.  Talk to your work about taking leave once the baby comes.  Pack your hospital bag.  Prepare the baby's room.  Go to your doctor visits.  Buy a rear-facing car seat. Learn how to install it in your car. General instructions  Do not use hot tubs, steam rooms, or saunas.  Do not use any products that contain nicotine or tobacco, such as cigarettes and e-cigarettes. If you need help quitting, ask your doctor.  Do not drink alcohol.  Do not douche or use tampons or scented sanitary pads.  Do not cross your legs for long periods of time.  Do not travel for long distances unless you must. Only do so if your doctor says it is okay.  Visit your dentist if you have not gone during your pregnancy. Use a soft toothbrush to brush your teeth. Be gentle when you floss.  Avoid cat litter boxes and soil  used by cats. These carry germs that can cause birth defects in the baby and can cause a loss of your baby (miscarriage) or stillbirth.  Keep all your prenatal visits as told by your doctor. This is important. Contact a doctor if:  You are not sure if you are in labor or if your water has broken.  You are dizzy.  You have mild cramps or pressure in your lower belly.  You have a nagging pain in your belly area.  You continue to feel sick to your stomach, you throw up, or you have watery poop.  You have bad smelling fluid coming from your vagina.  You have  pain when you pee. Get help right away if:  You have a fever.  You are leaking fluid from your vagina.  You are spotting or bleeding from your vagina.  You have severe belly cramps or pain.  You lose or gain weight quickly.  You have trouble catching your breath and have chest pain.  You notice sudden or extreme puffiness (swelling) of your face, hands, ankles, feet, or legs.  You have not felt the baby move in over an hour.  You have severe headaches that do not go away with medicine.  You have trouble seeing.  You are leaking, or you are having a gush of fluid, from your vagina before you are 37 weeks.  You have regular belly spasms (contractions) before you are 37 weeks. Summary  The third trimester is from week 28 through week 40 (months 7 through 9). This time is when your unborn baby is growing very fast.  Follow your doctor's advice about medicine, food, and activity.  Get ready for the arrival of your baby by taking prenatal classes, getting all the baby items ready, preparing the baby's room, and visiting your doctor to be checked.  Get help right away if you are bleeding from your vagina, or you have chest pain and trouble catching your breath, or if you have not felt your baby move in over an hour. This information is not intended to replace advice given to you by your health care provider. Make sure you discuss any questions you have with your health care provider. Document Revised: 06/04/2018 Document Reviewed: 03/19/2016 Elsevier Patient Education  [place]. Common Medications Safe in Pregnancy  Acne:      Constipation:  Benzoyl Peroxide     Colace  Clindamycin      Dulcolax Suppository  Topica Erythromycin     Fibercon  Salicylic Acid      Metamucil         Miralax AVOID:        Senakot   Accutane    Cough:  Retin-A       Cough Drops  Tetracycline      Phenergan w/ Codeine if Rx  Minocycline      Robitussin (Plain &  DM)  Antibiotics:     Crabs/Lice:  Ceclor       RID  Cephalosporins    AVOID:  E-Mycins      Kwell  Keflex  Macrobid/Macrodantin   Diarrhea:  Penicillin      Kao-Pectate  Zithromax      Imodium AD         PUSH FLUIDS AVOID:       Cipro     Fever:  Tetracycline      Tylenol (Regular or Extra  Minocycline       Strength)  Levaquin  Extra Strength-Do not          Exceed 8 tabs/24 hrs Caffeine:        <200mg/day (equiv. To 1 cup of coffee or  approx. 3 12 oz sodas)         Gas: Cold/Hayfever:       Gas-X  Benadryl      Mylicon  Claritin       Phazyme  **Claritin-D        Chlor-Trimeton    Headaches:  Dimetapp      ASA-Free Excedrin  Drixoral-Non-Drowsy     Cold Compress  Mucinex (Guaifenasin)     Tylenol (Regular or Extra  Sudafed/Sudafed-12 Hour     Strength)  **Sudafed PE Pseudoephedrine   Tylenol Cold & Sinus     Vicks Vapor Rub  Zyrtec  **AVOID if Problems With Blood Pressure         Heartburn: Avoid lying down for at least 1 hour after meals  Aciphex      Maalox     Rash:  Milk of Magnesia     Benadryl    Mylanta       1% Hydrocortisone Cream  Pepcid  Pepcid Complete   Sleep Aids:  Prevacid      Ambien   Prilosec       Benadryl  Rolaids       Chamomile Tea  Tums (Limit 4/day)     Unisom  Zantac       Tylenol PM         Warm milk-add vanilla or  Hemorrhoids:       Sugar for taste  Anusol/Anusol H.C.  (RX: Analapram 2.5%)  Sugar Substitutes:  Hydrocortisone OTC     Ok in moderation  Preparation H      Tucks        Vaseline lotion applied to tissue with wiping    Herpes:     Throat:  Acyclovir      Oragel  Famvir  Valtrex     Vaccines:         Flu Shot Leg Cramps:       *Gardasil  Benadryl      Hepatitis A         Hepatitis B Nasal Spray:       Pneumovax  Saline Nasal Spray     Polio Booster         Tetanus Nausea:       Tuberculosis test or PPD  Vitamin B6 25 mg TID   AVOID:    Dramamine      *Gardasil  Emetrol       Live  Poliovirus  Ginger Root 250 mg QID    MMR (measles, mumps &  High Complex Carbs @ Bedtime    rebella)  Sea Bands-Accupressure    Varicella (Chickenpox)  Unisom 1/2 tab TID     *No known complications           If received before Pain:         Known pregnancy;   Darvocet       Resume series after  Lortab        Delivery  Percocet    Yeast:   Tramadol      Femstat  Tylenol 3      Gyne-lotrimin  Ultram       Monistat  Vicodin           MISC:           All Sunscreens           Hair Coloring/highlights          Insect Repellant's          (Including DEET)         Mystic Tans Breastfeeding  Choosing to breastfeed is one of the best decisions you can make for yourself and your baby. A change in hormones during pregnancy causes your breasts to make breast milk in your milk-producing glands. Hormones prevent breast milk from being released before your baby is born. They also prompt milk flow after birth. Once breastfeeding has begun, thoughts of your baby, as well as his or her sucking or crying, can stimulate the release of milk from your milk-producing glands. Benefits of breastfeeding Research shows that breastfeeding offers many health benefits for infants and mothers. It also offers a cost-free and convenient way to feed your baby. For your baby  Your first milk (colostrum) helps your baby's digestive system to function better.  Special cells in your milk (antibodies) help your baby to fight off infections.  Breastfed babies are less likely to develop asthma, allergies, obesity, or type 2 diabetes. They are also at lower risk for sudden infant death syndrome (SIDS).  Nutrients in breast milk are better able to meet your baby's needs compared to infant formula.  Breast milk improves your baby's brain development. For you  Breastfeeding helps to create a very special bond between you and your baby.  Breastfeeding is convenient. Breast milk costs nothing and is always available at the  correct temperature.  Breastfeeding helps to burn calories. It helps you to lose the weight that you gained during pregnancy.  Breastfeeding makes your uterus return faster to its size before pregnancy. It also slows bleeding (lochia) after you give birth.  Breastfeeding helps to lower your risk of developing type 2 diabetes, osteoporosis, rheumatoid arthritis, cardiovascular disease, and breast, ovarian, uterine, and endometrial cancer later in life. Breastfeeding basics Starting breastfeeding  Find a comfortable place to sit or lie down, with your neck and back well-supported.  Place a pillow or a rolled-up blanket under your baby to bring him or her to the level of your breast (if you are seated). Nursing pillows are specially designed to help support your arms and your baby while you breastfeed.  Make sure that your baby's tummy (abdomen) is facing your abdomen.  Gently massage your breast. With your fingertips, massage from the outer edges of your breast inward toward the nipple. This encourages milk flow. If your milk flows slowly, you may need to continue this action during the feeding.  Support your breast with 4 fingers underneath and your thumb above your nipple (make the letter "C" with your hand). Make sure your fingers are well away from your nipple and your baby's mouth.  Stroke your baby's lips gently with your finger or nipple.  When your baby's mouth is open wide enough, quickly bring your baby to your breast, placing your entire nipple and as much of the areola as possible into your baby's mouth. The areola is the colored area around your nipple. ? More areola should be visible above your baby's upper lip than below the lower lip. ? Your baby's lips should be opened and extended outward (flanged) to ensure an adequate, comfortable latch. ? Your baby's tongue should be between his or her lower gum and your breast.  Make sure that your baby's mouth is correctly positioned  around your nipple (latched). Your  baby's lips should create a seal on your breast and be turned out (everted).  It is common for your baby to suck about 2-3 minutes in order to start the flow of breast milk. Latching Teaching your baby how to latch onto your breast properly is very important. An improper latch can cause nipple pain, decreased milk supply, and poor weight gain in your baby. Also, if your baby is not latched onto your nipple properly, he or she may swallow some air during feeding. This can make your baby fussy. Burping your baby when you switch breasts during the feeding can help to get rid of the air. However, teaching your baby to latch on properly is still the best way to prevent fussiness from swallowing air while breastfeeding. Signs that your baby has successfully latched onto your nipple  Silent tugging or silent sucking, without causing you pain. Infant's lips should be extended outward (flanged).  Swallowing heard between every 3-4 sucks once your milk has started to flow (after your let-down milk reflex occurs).  Muscle movement above and in front of his or her ears while sucking. Signs that your baby has not successfully latched onto your nipple  Sucking sounds or smacking sounds from your baby while breastfeeding.  Nipple pain. If you think your baby has not latched on correctly, slip your finger into the corner of your baby's mouth to break the suction and place it between your baby's gums. Attempt to start breastfeeding again. Signs of successful breastfeeding Signs from your baby  Your baby will gradually decrease the number of sucks or will completely stop sucking.  Your baby will fall asleep.  Your baby's body will relax.  Your baby will retain a small amount of milk in his or her mouth.  Your baby will let go of your breast by himself or herself. Signs from you  Breasts that have increased in firmness, weight, and size 1-3 hours after  feeding.  Breasts that are softer immediately after breastfeeding.  Increased milk volume, as well as a change in milk consistency and color by the fifth day of breastfeeding.  Nipples that are not sore, cracked, or bleeding. Signs that your baby is getting enough milk  Wetting at least 1-2 diapers during the first 24 hours after birth.  Wetting at least 5-6 diapers every 24 hours for the first week after birth. The urine should be clear or pale yellow by the age of 5 days.  Wetting 6-8 diapers every 24 hours as your baby continues to grow and develop.  At least 3 stools in a 24-hour period by the age of 5 days. The stool should be soft and yellow.  At least 3 stools in a 24-hour period by the age of 7 days. The stool should be seedy and yellow.  No loss of weight greater than 10% of birth weight during the first 3 days of life.  Average weight gain of 4-7 oz (113-198 g) per week after the age of 4 days.  Consistent daily weight gain by the age of 5 days, without weight loss after the age of 2 weeks. After a feeding, your baby may spit up a small amount of milk. This is normal. Breastfeeding frequency and duration Frequent feeding will help you make more milk and can prevent sore nipples and extremely full breasts (breast engorgement). Breastfeed when you feel the need to reduce the fullness of your breasts or when your baby shows signs of hunger. This is called "breastfeeding on   demand." Signs that your baby is hungry include:  Increased alertness, activity, or restlessness.  Movement of the head from side to side.  Opening of the mouth when the corner of the mouth or cheek is stroked (rooting).  Increased sucking sounds, smacking lips, cooing, sighing, or squeaking.  Hand-to-mouth movements and sucking on fingers or hands.  Fussing or crying. Avoid introducing a pacifier to your baby in the first 4-6 weeks after your baby is born. After this time, you may choose to use a  pacifier. Research has shown that pacifier use during the first year of a baby's life decreases the risk of sudden infant death syndrome (SIDS). Allow your baby to feed on each breast as long as he or she wants. When your baby unlatches or falls asleep while feeding from the first breast, offer the second breast. Because newborns are often sleepy in the first few weeks of life, you may need to awaken your baby to get him or her to feed. Breastfeeding times will vary from baby to baby. However, the following rules can serve as a guide to help you make sure that your baby is properly fed:  Newborns (babies 4 weeks of age or younger) may breastfeed every 1-3 hours.  Newborns should not go without breastfeeding for longer than 3 hours during the day or 5 hours during the night.  You should breastfeed your baby a minimum of 8 times in a 24-hour period. Breast milk pumping     Pumping and storing breast milk allows you to make sure that your baby is exclusively fed your breast milk, even at times when you are unable to breastfeed. This is especially important if you go back to work while you are still breastfeeding, or if you are not able to be present during feedings. Your lactation consultant can help you find a method of pumping that works best for you and give you guidelines about how long it is safe to store breast milk. Caring for your breasts while you breastfeed Nipples can become dry, cracked, and sore while breastfeeding. The following recommendations can help keep your breasts moisturized and healthy:  Avoid using soap on your nipples.  Wear a supportive bra designed especially for nursing. Avoid wearing underwire-style bras or extremely tight bras (sports bras).  Air-dry your nipples for 3-4 minutes after each feeding.  Use only cotton bra pads to absorb leaked breast milk. Leaking of breast milk between feedings is normal.  Use lanolin on your nipples after breastfeeding. Lanolin  helps to maintain your skin's normal moisture barrier. Pure lanolin is not harmful (not toxic) to your baby. You may also hand express a few drops of breast milk and gently massage that milk into your nipples and allow the milk to air-dry. In the first few weeks after giving birth, some women experience breast engorgement. Engorgement can make your breasts feel heavy, warm, and tender to the touch. Engorgement peaks within 3-5 days after you give birth. The following recommendations can help to ease engorgement:  Completely empty your breasts while breastfeeding or pumping. You may want to start by applying warm, moist heat (in the shower or with warm, water-soaked hand towels) just before feeding or pumping. This increases circulation and helps the milk flow. If your baby does not completely empty your breasts while breastfeeding, pump any extra milk after he or she is finished.  Apply ice packs to your breasts immediately after breastfeeding or pumping, unless this is too uncomfortable for you. To   do this: ? Put ice in a plastic bag. ? Place a towel between your skin and the bag. ? Leave the ice on for 20 minutes, 2-3 times a day.  Make sure that your baby is latched on and positioned properly while breastfeeding. If engorgement persists after 48 hours of following these recommendations, contact your health care provider or a Science writer. Overall health care recommendations while breastfeeding  Eat 3 healthy meals and 3 snacks every day. Well-nourished mothers who are breastfeeding need an additional 450-500 calories a day. You can meet this requirement by increasing the amount of a balanced diet that you eat.  Drink enough water to keep your urine pale yellow or clear.  Rest often, relax, and continue to take your prenatal vitamins to prevent fatigue, stress, and low vitamin and mineral levels in your body (nutrient deficiencies).  Do not use any products that contain nicotine or  tobacco, such as cigarettes and e-cigarettes. Your baby may be harmed by chemicals from cigarettes that pass into breast milk and exposure to secondhand smoke. If you need help quitting, ask your health care provider.  Avoid alcohol.  Do not use illegal drugs or marijuana.  Talk with your health care provider before taking any medicines. These include over-the-counter and prescription medicines as well as vitamins and herbal supplements. Some medicines that may be harmful to your baby can pass through breast milk.  It is possible to become pregnant while breastfeeding. If birth control is desired, ask your health care provider about options that will be safe while breastfeeding your baby. Where to find more information: [employer]: www.llli.org Contact a health care provider if:  You feel like you want to stop breastfeeding or have become frustrated with breastfeeding.  Your nipples are cracked or bleeding.  Your breasts are red, tender, or warm.  You have: ? Painful breasts or nipples. ? A swollen area on either breast. ? A fever or chills. ? Nausea or vomiting. ? Drainage other than breast milk from your nipples.  Your breasts do not become full before feedings by the fifth day after you give birth.  You feel sad and depressed.  Your baby is: ? Too sleepy to eat well. ? Having trouble sleeping. ? More than 3 week old and wetting fewer than 6 diapers in a 24-hour period. ? Not gaining weight by 61 days of age.  Your baby has fewer than 3 stools in a 24-hour period.  Your baby's skin or the white parts of his or her eyes become yellow. Get help right away if:  Your baby is overly tired (lethargic) and does not want to wake up and feed.  Your baby develops an unexplained fever. Summary  Breastfeeding offers many health benefits for infant and mothers.  Try to breastfeed your infant when he or she shows early signs of hunger.  Gently tickle or stroke  your baby's lips with your finger or nipple to allow the baby to open his or her mouth. Bring the baby to your breast. Make sure that much of the areola is in your baby's mouth. Offer one side and burp the baby before you offer the other side.  Talk with your health care provider or lactation consultant if you have questions or you face problems as you breastfeed. This information is not intended to replace advice given to you by your health care provider. Make sure you discuss any questions you have with your health care provider. Document Revised: 05/08/2017  Document Reviewed: 03/15/2016 Elsevier Patient Education  [employer].

## 2019-07-27 NOTE — Progress Notes (Signed)
ROB-Pt present for routine prenatal care. Pt stated having braxton hick contractions off and on. No other issues at this time.

## 2019-07-28 ENCOUNTER — Encounter: Payer: Self-pay | Admitting: Obstetrics and Gynecology

## 2019-07-28 ENCOUNTER — Other Ambulatory Visit: Payer: Self-pay

## 2019-07-28 ENCOUNTER — Ambulatory Visit (INDEPENDENT_AMBULATORY_CARE_PROVIDER_SITE_OTHER): Payer: Medicaid Other | Admitting: Obstetrics and Gynecology

## 2019-07-28 VITALS — BP 93/62 | HR 100 | Wt 248.9 lb

## 2019-07-28 DIAGNOSIS — Z86718 Personal history of other venous thrombosis and embolism: Secondary | ICD-10-CM

## 2019-07-28 DIAGNOSIS — Z3A32 32 weeks gestation of pregnancy: Secondary | ICD-10-CM | POA: Diagnosis not present

## 2019-07-28 DIAGNOSIS — Z7901 Long term (current) use of anticoagulants: Secondary | ICD-10-CM

## 2019-07-28 DIAGNOSIS — O479 False labor, unspecified: Secondary | ICD-10-CM

## 2019-07-28 DIAGNOSIS — O0993 Supervision of high risk pregnancy, unspecified, third trimester: Secondary | ICD-10-CM

## 2019-07-28 DIAGNOSIS — O99210 Obesity complicating pregnancy, unspecified trimester: Secondary | ICD-10-CM

## 2019-07-28 LAB — POCT URINALYSIS DIPSTICK OB
Bilirubin, UA: NEGATIVE
Blood, UA: NEGATIVE
Glucose, UA: NEGATIVE
Nitrite, UA: NEGATIVE
Spec Grav, UA: 1.02 (ref 1.010–1.025)
Urobilinogen, UA: 0.2 E.U./dL
pH, UA: 7 (ref 5.0–8.0)

## 2019-07-28 NOTE — Progress Notes (Signed)
ROB: Patient noting some Briana Cruz.  Asks about when next ultrasound will be. For growth scan at 36 weeks, and to begin antenatal testing at that time. Discussion had on delivery plan, will induce at 39 weeks (also due to BMI), to discontinue Lovenox 24 hrs prior to induction date. Does not need to continue in labor. Is considering breastfeeding. Considering Nexplanon for contraception but also willing to consider Mirena IUD. RTC in 2 weeks.

## 2019-08-11 ENCOUNTER — Other Ambulatory Visit: Payer: Self-pay

## 2019-08-11 ENCOUNTER — Ambulatory Visit (INDEPENDENT_AMBULATORY_CARE_PROVIDER_SITE_OTHER): Payer: Medicaid Other | Admitting: Obstetrics and Gynecology

## 2019-08-11 ENCOUNTER — Encounter: Payer: Self-pay | Admitting: Obstetrics and Gynecology

## 2019-08-11 VITALS — BP 90/59 | HR 82 | Wt 250.7 lb

## 2019-08-11 DIAGNOSIS — Z3A34 34 weeks gestation of pregnancy: Secondary | ICD-10-CM

## 2019-08-11 DIAGNOSIS — O0993 Supervision of high risk pregnancy, unspecified, third trimester: Secondary | ICD-10-CM

## 2019-08-11 LAB — POCT URINALYSIS DIPSTICK OB
Bilirubin, UA: NEGATIVE
Blood, UA: NEGATIVE
Glucose, UA: NEGATIVE
Ketones, UA: NEGATIVE
Nitrite, UA: NEGATIVE
Odor: NEGATIVE
POC,PROTEIN,UA: NEGATIVE
Spec Grav, UA: 1.02 (ref 1.010–1.025)
Urobilinogen, UA: 0.2 E.U./dL
pH, UA: 7.5 (ref 5.0–8.0)

## 2019-08-11 NOTE — Progress Notes (Signed)
ROB- Pt desires to stop working at this time. She is up on her feet. Working in [employer].

## 2019-08-11 NOTE — Progress Notes (Signed)
ROB: Patient doing well.  Reports daily movement.  Using Lovenox as directed.  Patient getting more "tired" at work but will work for 2-3 more weeks.  Delivery after 39 weeks again discussed.  Cultures next visit.

## 2019-08-20 ENCOUNTER — Other Ambulatory Visit: Payer: Self-pay | Admitting: Obstetrics and Gynecology

## 2019-08-20 NOTE — Telephone Encounter (Signed)
pts mom called in and stated that the pharmacy sent over a refill and the pt is needing it asap. The mother states she is in the red. I told the mom I will send a message to the nurse. Please advise  FH not here

## 2019-08-21 ENCOUNTER — Observation Stay
Admission: EM | Admit: 2019-08-21 | Discharge: 2019-08-21 | Disposition: A | Discharge status: Home / Self Care | Payer: Medicaid Other | Attending: Obstetrics and Gynecology | Admitting: Obstetrics and Gynecology

## 2019-08-21 ENCOUNTER — Encounter: Payer: Self-pay | Admitting: Obstetrics and Gynecology

## 2019-08-21 ENCOUNTER — Other Ambulatory Visit: Payer: Self-pay

## 2019-08-21 DIAGNOSIS — Z3A35 35 weeks gestation of pregnancy: Secondary | ICD-10-CM | POA: Diagnosis not present

## 2019-08-21 DIAGNOSIS — Z3493 Encounter for supervision of normal pregnancy, unspecified, third trimester: Secondary | ICD-10-CM | POA: Diagnosis not present

## 2019-08-21 DIAGNOSIS — Z3A36 36 weeks gestation of pregnancy: Secondary | ICD-10-CM

## 2019-08-21 LAB — RUPTURE OF MEMBRANE (ROM)PLUS: Rom Plus: NEGATIVE

## 2019-08-21 NOTE — Discharge Summary (Signed)
    L&D OB Triage Note  SUBJECTIVE Briana Cruz is a 19 y.o. G1P0 female at [redacted]w[redacted]d, EDD Estimated Date of Delivery: 09/20/19 who presented to triage with complaints of water broke @ 0630 this am. Not having to wear a pad. Denies ctx, vaginal bleeding.  OB History  Gravida Para Term Preterm AB Living  1 0 0 0 0 0  SAB TAB Ectopic Multiple Live Births  0 0 0 0 0    # Outcome Date GA Lbr Len/2nd Weight Sex Delivery Anes PTL Lv  1 Current             No medications prior to admission.     OBJECTIVE  Nursing Evaluation:   BP (!) 96/47   Pulse (!) 107   Ht 5\' 4"  (1.626 m)   Wt 113.4 kg   LMP 12/04/2018   BMI 42.91 kg/m    Findings:        ROM plus = negative     No obvious evidence of ROM     No contractions      NST was performed and has been reviewed by me.  NST INTERPRETATION: Category I  Mode: External Baseline Rate (A): 145 bpm Variability: Moderate Accelerations: 15 x 15 Decelerations: None     Contraction Frequency (min): ctx x 1 with ui  ASSESSMENT Impression:  1.  Pregnancy:  G1P0 at [redacted]w[redacted]d , EDD Estimated Date of Delivery: 09/20/19 2.  Reassuring fetal and maternal status 3.  No evidence of ROM  PLAN 1. Discussed current condition and above findings with patient and reassurance given.  All questions answered. 2. Discharge home with standard labor precautions given to return to L&D or call the office for problems. 3. Continue routine prenatal care.

## 2019-08-21 NOTE — OB Triage Note (Signed)
Pt thinks her water broke @ 0630 this am. Not having to wear a pad. Denies ctx, vaginal bleeding. Elaina Hoops

## 2019-08-25 ENCOUNTER — Ambulatory Visit (INDEPENDENT_AMBULATORY_CARE_PROVIDER_SITE_OTHER): Payer: Medicaid Other | Admitting: Obstetrics and Gynecology

## 2019-08-25 ENCOUNTER — Other Ambulatory Visit: Payer: Self-pay

## 2019-08-25 ENCOUNTER — Encounter: Payer: Self-pay | Admitting: Obstetrics and Gynecology

## 2019-08-25 ENCOUNTER — Ambulatory Visit (INDEPENDENT_AMBULATORY_CARE_PROVIDER_SITE_OTHER): Payer: Medicaid Other

## 2019-08-25 VITALS — BP 96/62 | HR 102

## 2019-08-25 DIAGNOSIS — O99210 Obesity complicating pregnancy, unspecified trimester: Secondary | ICD-10-CM

## 2019-08-25 DIAGNOSIS — Z7901 Long term (current) use of anticoagulants: Secondary | ICD-10-CM

## 2019-08-25 DIAGNOSIS — Z9281 Personal history of extracorporeal membrane oxygenation (ECMO): Secondary | ICD-10-CM

## 2019-08-25 DIAGNOSIS — Z8619 Personal history of other infectious and parasitic diseases: Secondary | ICD-10-CM

## 2019-08-25 DIAGNOSIS — Z3A36 36 weeks gestation of pregnancy: Secondary | ICD-10-CM

## 2019-08-25 DIAGNOSIS — Z86718 Personal history of other venous thrombosis and embolism: Secondary | ICD-10-CM

## 2019-08-25 DIAGNOSIS — O0993 Supervision of high risk pregnancy, unspecified, third trimester: Secondary | ICD-10-CM

## 2019-08-25 HISTORY — DX: Personal history of other infectious and parasitic diseases: Z86.19

## 2019-08-25 LAB — POCT URINALYSIS DIPSTICK OB
Bilirubin, UA: NEGATIVE
Blood, UA: NEGATIVE
Glucose, UA: NEGATIVE
Ketones, UA: NEGATIVE
Nitrite, UA: NEGATIVE
Spec Grav, UA: 1.025 (ref 1.010–1.025)
Urobilinogen, UA: 0.2 E.U./dL
pH, UA: 6.5 (ref 5.0–8.0)

## 2019-08-25 MED ORDER — ENOXAPARIN SODIUM 40 MG/0.4ML ~~LOC~~ SOLN: SUBCUTANEOUS | 1 refills | Status: DC

## 2019-08-25 NOTE — Patient Instructions (Signed)

## 2019-08-25 NOTE — Progress Notes (Signed)
ROB-Pt present for routine prenatal care. Pt stated having swollen feet off and on.

## 2019-08-25 NOTE — Progress Notes (Signed)
ROB: Patient complains of intermittent bilateral feet swelling. Also notes that she is almost out of her Lovenox injections.  Given refill.  36 week labs done today.  Growth scan today noting growth 14%ile (5 lbs 3 oz), normal AFI and BPP. RTC in 1 week, for NSTs weekly.

## 2019-08-27 ENCOUNTER — Inpatient Hospital Stay
Admission: EM | Admit: 2019-08-27 | Discharge: 2019-08-30 | DRG: 807 | Disposition: A | Discharge status: Home / Self Care | Payer: Medicaid Other | Attending: Obstetrics and Gynecology | Admitting: Obstetrics and Gynecology

## 2019-08-27 ENCOUNTER — Other Ambulatory Visit: Payer: Self-pay

## 2019-08-27 ENCOUNTER — Encounter: Payer: Self-pay | Admitting: Obstetrics and Gynecology

## 2019-08-27 ENCOUNTER — Inpatient Hospital Stay: Payer: Medicaid Other | Admitting: Anesthesiology

## 2019-08-27 DIAGNOSIS — O42013 Preterm premature rupture of membranes, onset of labor within 24 hours of rupture, third trimester: Secondary | ICD-10-CM | POA: Diagnosis present

## 2019-08-27 DIAGNOSIS — Z880 Allergy status to penicillin: Secondary | ICD-10-CM | POA: Diagnosis not present

## 2019-08-27 DIAGNOSIS — O99824 Streptococcus B carrier state complicating childbirth: Secondary | ICD-10-CM | POA: Diagnosis present

## 2019-08-27 DIAGNOSIS — Z283 Underimmunization status: Secondary | ICD-10-CM | POA: Diagnosis not present

## 2019-08-27 DIAGNOSIS — O99214 Obesity complicating childbirth: Secondary | ICD-10-CM | POA: Diagnosis present

## 2019-08-27 DIAGNOSIS — J45909 Unspecified asthma, uncomplicated: Secondary | ICD-10-CM | POA: Diagnosis present

## 2019-08-27 DIAGNOSIS — O09899 Supervision of other high risk pregnancies, unspecified trimester: Secondary | ICD-10-CM | POA: Diagnosis not present

## 2019-08-27 DIAGNOSIS — Z3A36 36 weeks gestation of pregnancy: Secondary | ICD-10-CM

## 2019-08-27 DIAGNOSIS — Z7901 Long term (current) use of anticoagulants: Secondary | ICD-10-CM

## 2019-08-27 DIAGNOSIS — Z86718 Personal history of other venous thrombosis and embolism: Secondary | ICD-10-CM | POA: Diagnosis not present

## 2019-08-27 DIAGNOSIS — O9952 Diseases of the respiratory system complicating childbirth: Secondary | ICD-10-CM | POA: Diagnosis present

## 2019-08-27 DIAGNOSIS — O9081 Anemia of the puerperium: Secondary | ICD-10-CM | POA: Diagnosis not present

## 2019-08-27 DIAGNOSIS — Z20822 Contact with and (suspected) exposure to covid-19: Secondary | ICD-10-CM | POA: Diagnosis present

## 2019-08-27 DIAGNOSIS — E669 Obesity, unspecified: Secondary | ICD-10-CM | POA: Diagnosis present

## 2019-08-27 DIAGNOSIS — O42913 Preterm premature rupture of membranes, unspecified as to length of time between rupture and onset of labor, third trimester: Secondary | ICD-10-CM | POA: Diagnosis present

## 2019-08-27 DIAGNOSIS — Z8619 Personal history of other infectious and parasitic diseases: Secondary | ICD-10-CM | POA: Diagnosis present

## 2019-08-27 DIAGNOSIS — Z9281 Personal history of extracorporeal membrane oxygenation (ECMO): Secondary | ICD-10-CM | POA: Diagnosis not present

## 2019-08-27 DIAGNOSIS — Z8759 Personal history of other complications of pregnancy, childbirth and the puerperium: Secondary | ICD-10-CM | POA: Diagnosis present

## 2019-08-27 DIAGNOSIS — O99210 Obesity complicating pregnancy, unspecified trimester: Secondary | ICD-10-CM | POA: Diagnosis not present

## 2019-08-27 DIAGNOSIS — Z6841 Body Mass Index (BMI) 40.0 and over, adult: Secondary | ICD-10-CM | POA: Diagnosis present

## 2019-08-27 LAB — CBC
HCT: 33 % — ABNORMAL LOW (ref 36.0–46.0)
Hemoglobin: 11.3 g/dL — ABNORMAL LOW (ref 12.0–15.0)
MCH: 30.7 pg (ref 26.0–34.0)
MCHC: 34.2 g/dL (ref 30.0–36.0)
MCV: 89.7 fL (ref 80.0–100.0)
Platelets: 237 10*3/uL (ref 150–400)
RBC: 3.68 MIL/uL — ABNORMAL LOW (ref 3.87–5.11)
RDW: 13.2 % (ref 11.5–15.5)
WBC: 12.1 10*3/uL — ABNORMAL HIGH (ref 4.0–10.5)
nRBC: 0 % (ref 0.0–0.2)

## 2019-08-27 LAB — TYPE AND SCREEN
ABO/RH(D): A POS
Antibody Screen: NEGATIVE

## 2019-08-27 LAB — SARS CORONAVIRUS 2 BY RT PCR (HOSPITAL ORDER, PERFORMED IN ~~LOC~~ HOSPITAL LAB): SARS Coronavirus 2: NEGATIVE

## 2019-08-27 MED ORDER — ACETAMINOPHEN 325 MG PO TABS: 650.0000 mg | ORAL_TABLET | ORAL | Status: DC | PRN

## 2019-08-27 MED ORDER — CLINDAMYCIN PHOSPHATE 900 MG/50ML IV SOLN
INTRAVENOUS | Status: AC
  Filled 2019-08-27: qty 50

## 2019-08-27 MED ORDER — FENTANYL 2.5 MCG/ML W/ROPIVACAINE 0.15% IN NS 100 ML EPIDURAL (ARMC)
EPIDURAL | Status: AC
  Filled 2019-08-27: qty 100

## 2019-08-27 MED ORDER — LIDOCAINE HCL (PF) 1 % IJ SOLN: 30.0000 mL | INTRAMUSCULAR | Status: DC | PRN

## 2019-08-27 MED ORDER — OXYTOCIN 10 UNIT/ML IJ SOLN
INTRAMUSCULAR | Status: AC
  Filled 2019-08-27: qty 2

## 2019-08-27 MED ORDER — ONDANSETRON HCL 4 MG/2ML IJ SOLN: 4.0000 mg | Freq: Four times a day (QID) | INTRAMUSCULAR | Status: DC | PRN

## 2019-08-27 MED ORDER — MISOPROSTOL 50MCG HALF TABLET
50.0000 ug | ORAL_TABLET | Freq: Once | ORAL | Status: AC
  Administered 2019-08-27: 50 ug via ORAL
  Filled 2019-08-27: qty 1

## 2019-08-27 MED ORDER — BUTORPHANOL TARTRATE 1 MG/ML IJ SOLN: 1.0000 mg | INTRAMUSCULAR | Status: DC | PRN

## 2019-08-27 MED ORDER — LACTATED RINGERS IV SOLN
125.0000 mL/h | INTRAVENOUS | Status: DC
  Administered 2019-08-27 – 2019-08-28 (×2): 125 mL/h via INTRAVENOUS

## 2019-08-27 MED ORDER — OXYCODONE-ACETAMINOPHEN 5-325 MG PO TABS: 2.0000 | ORAL_TABLET | ORAL | Status: DC | PRN

## 2019-08-27 MED ORDER — LACTATED RINGERS IV SOLN
500.0000 mL | INTRAVENOUS | Status: DC | PRN
  Administered 2019-08-28: 500 mL via INTRAVENOUS

## 2019-08-27 MED ORDER — LACTATED RINGERS IV BOLUS
1000.0000 mL | Freq: Once | INTRAVENOUS | Status: AC
  Administered 2019-08-27: 1000 mL via INTRAVENOUS

## 2019-08-27 MED ORDER — OXYTOCIN-SODIUM CHLORIDE 30-0.9 UT/500ML-% IV SOLN
2.5000 [IU]/h | INTRAVENOUS | Status: DC
  Filled 2019-08-27: qty 500

## 2019-08-27 MED ORDER — CLINDAMYCIN PHOSPHATE 900 MG/50ML IV SOLN
900.0000 mg | Freq: Three times a day (TID) | INTRAVENOUS | Status: DC
  Administered 2019-08-27: 900 mg via INTRAVENOUS
  Filled 2019-08-27 (×2): qty 50

## 2019-08-27 MED ORDER — OXYTOCIN-SODIUM CHLORIDE 30-0.9 UT/500ML-% IV SOLN: 1.0000 m[IU]/min | INTRAVENOUS | Status: DC

## 2019-08-27 MED ORDER — LIDOCAINE HCL (PF) 1 % IJ SOLN
INTRAMUSCULAR | Status: AC
  Filled 2019-08-27: qty 30

## 2019-08-27 MED ORDER — OXYCODONE-ACETAMINOPHEN 5-325 MG PO TABS: 1.0000 | ORAL_TABLET | ORAL | Status: DC | PRN

## 2019-08-27 MED ORDER — OXYTOCIN BOLUS FROM INFUSION
333.0000 mL | Freq: Once | INTRAVENOUS | Status: AC
  Administered 2019-08-28: 333 mL via INTRAVENOUS

## 2019-08-27 MED ORDER — TERBUTALINE SULFATE 1 MG/ML IJ SOLN: 0.2500 mg | Freq: Once | INTRAMUSCULAR | Status: DC | PRN

## 2019-08-27 MED ORDER — MISOPROSTOL 200 MCG PO TABS
ORAL_TABLET | ORAL | Status: AC
  Administered 2019-08-28: 800 ug
  Filled 2019-08-27: qty 4

## 2019-08-27 MED ORDER — SOD CITRATE-CITRIC ACID 500-334 MG/5ML PO SOLN: 30.0000 mL | ORAL | Status: DC | PRN

## 2019-08-27 MED ORDER — AMMONIA AROMATIC IN INHA
RESPIRATORY_TRACT | Status: AC
  Filled 2019-08-27: qty 10

## 2019-08-27 NOTE — OB Triage Note (Signed)
Pt is 103w4d and G1P0 presenting to L&D with complaint of SROM at 2000. Pt states the fluid was clear and a moderate amount. Pt confirms positive fetal movement and denies VB. Pt states she is having cramping discomfort in her abdomen and rates it 8/10 on a 0-10 pain scale. MD called and updated about pts arrival. POC discussed. VSS. Parents at bedside. No further needs at this time.

## 2019-08-27 NOTE — H&P (Signed)
Obstetric History and Physical  Briana Cruz is a 19 y.o. G1P0 with IUP at [redacted]w[redacted]d presenting for complaints of ruptured membranes since approximately 8:30 pm this evening. Patient states she has not been having contractions, denies vaginal bleeding, ruptured, clear fluid membranes, with active fetal movement.  Currently on Lovenox injections for history of blood clots. Last dose was last night around 8 or 9 pm.   Prenatal Course Source of Care: [facility] with onset of care at 12 weeks  Pregnancy complications or risks: Patient Active Problem List   Diagnosis Date Noted  . Preterm premature rupture of membranes (PPROM) with onset of labor within 24 hours of rupture in third trimester, antepartum 08/27/2019  . Indication for care in labor or delivery 08/21/2019  . On anticoagulant therapy 07/28/2019  . Susceptible to varicella (non-immune), currently pregnant 04/07/2019  . Obesity in pregnancy 04/07/2019  . History of blood clots 04/07/2019  . 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 Supervision of high risk pregnancy in second trimester 04/07/2019  . Abnormal thyroid function test 04/07/2019   She plans to breastfeed She desires IUD for postpartum contraception.   Prenatal labs and studies: ABO, Rh: A/Positive/-- (12/29 1609) Antibody: Negative (12/29 1609) Rubella: 5.37 (12/29 1609) Varicella: 137 (12/29 1609) RPR: Non Reactive (05/11 0823)  HBsAg: Negative (12/29 1609)  HIV: Non Reactive (12/29 1609)  GBS: positive 1 hr Glucola  normal Genetic screening normal Anatomy US normal   Past Medical History:  Diagnosis Date  . Asthma   . History of blood clots 07/11/2014  . Personal history of ECMO 07/11/2014  . Septic shock (HCC) 07/05/2014    Past Surgical History:  Procedure Laterality Date  . CARDIAC SURGERY    . EXTRACORPOREAL CIRCULATION    . OTHER SURGICAL HISTORY     Facial drain  . WISDOM TOOTH EXTRACTION      OB History  Gravida Para Term Preterm  AB Living  1            SAB TAB Ectopic Multiple Live Births               # Outcome Date GA Lbr Len/2nd Weight Sex Delivery Anes PTL Lv  1 Current             Social History   Socioeconomic History  . Marital status: Single    Spouse name: Not on file  . Number of children: Not on file  . Years of education: Not on file  . Highest education level: Not on file  Occupational History  . Not on file  Tobacco Use  . Smoking status: Never Smoker  . Smokeless tobacco: Never Used  Vaping Use  . Vaping Use: Never used  Substance and Sexual Activity  . Alcohol use: No  . Drug use: Not Currently    Types: Marijuana  . Sexual activity: Not Currently    Birth control/protection: I.U.D.  Other Topics Concern  . Not on file  Social History Narrative  . Not on file   Social Determinants of Health   Financial Resource Strain:   . Difficulty of Paying Living Expenses:   Food Insecurity:   . Worried About Programme researcher, broadcasting/film/video in the Last Year:   . Barista in the Last Year:   Transportation Needs:   . Freight forwarder (Medical):   Marland Kitchen Lack of Transportation (Non-Medical):   Physical Activity:   . Days of Exercise per Week:   .  Minutes of Exercise per Session:   Stress:   . Feeling of Stress :   Social Connections:   . Frequency of Communication with Friends and Family:   . Frequency of Social Gatherings with Friends and Family:   . Attends Religious Services:   . Active Member of Clubs or Organizations:   . Attends Banker Meetings:   Marland Kitchen Marital Status:     Family History  Problem Relation Age of Onset  . Healthy Mother   . Healthy Father   . Healthy Brother   . Congestive Heart Failure Maternal Grandmother   . Kidney disease Maternal Grandmother   . Diabetes Maternal Grandmother   . Hypertension Maternal Grandmother   . Diabetes Maternal Grandfather   . Gout Maternal Grandfather     Medications Prior to Admission  Medication Sig Dispense  Refill Last Dose  . enoxaparin (LOVENOX) 40 MG/0.4ML injection INJECT 0.4 ML UNDER THE SKIN ONCE DAILY 36 mL 1 08/26/2019 at Unknown time  . ferrous sulfate (FERROUSUL) 325 (65 FE) MG tablet Take 1 tablet (325 mg total) by mouth daily with breakfast. 60 tablet 1 08/26/2019 at Unknown time  . Prenatal Vit-Fe Fumarate-FA (PRENATAL MULTIVITAMIN) TABS tablet Take 1 tablet by mouth daily at 12 noon.   08/27/2019 at Unknown time  . PROAIR HFA 108 (90 Base) MCG/ACT inhaler INHALE 1 TO 2 PUFFS BY MOUTH INTO THE LUNGS EVERY 4 HOURS AS NEEDED 8.5 g 1 Past Week at Unknown time    Allergies  Allergen Reactions  . Penicillins     Rxn to PCN @ 19 yo. Unsure of reaction.     Review of Systems: Negative except for what is mentioned in HPI.  Physical Exam: BP (!) 100/57 (BP Location: Right Arm)   Pulse (!) 120   Temp 98.3 F (36.8 C) (Oral)   Resp 18   Ht 5\' 4"  (1.626 m)   Wt 113.4 kg   LMP 12/04/2018   BMI 42.91 kg/m  CONSTITUTIONAL: Well-developed, well-nourished female in no acute distress.  HENT:  Normocephalic, atraumatic, External right and left ear normal. Oropharynx is clear and moist EYES: Conjunctivae and EOM are normal. Pupils are equal, round, and reactive to light. No scleral icterus.  NECK: Normal range of motion, supple, no masses SKIN: Skin is warm and dry. No rash noted. Not diaphoretic. No erythema. No pallor. NEUROLOGIC: Alert and oriented to person, place, and time. Normal reflexes, muscle tone coordination. No cranial nerve deficit noted. PSYCHIATRIC: Normal mood and affect. Normal behavior. Normal judgment and thought content. CARDIOVASCULAR: Normal heart rate noted, regular rhythm RESPIRATORY: Effort and breath sounds normal, no problems with respiration noted ABDOMEN: Soft, nontender, nondistended, gravid. MUSCULOSKELETAL: Normal range of motion. No edema and no tenderness. 2+ distal pulses.  Cervical Exam: Dilatation 2 cm   Effacement 50%   Station - 3  Presentation:  cephalic FHT:  Baseline rate 125 bpm   Variability moderate  Accelerations present   Decelerations none Contractions: Every 2-4 mins   Pertinent Labs/Studies:   Results for orders placed or performed during the hospital encounter of 08/27/19 (from the past 24 hour(s))  SARS Coronavirus 2 by RT PCR (hospital order, performed in [facility] hospital lab) Nasopharyngeal Nasopharyngeal Swab     Status: None   Collection Time: 08/27/19  9:41 PM   Specimen: Nasopharyngeal Swab  Result Value Ref Range   SARS Coronavirus 2 NEGATIVE NEGATIVE  CBC     Status: Abnormal   Collection Time: 08/27/19  9:57 PM  Result Value Ref Range   WBC 12.1 (H) 4.0 - 10.5 K/uL   RBC 3.68 (L) 3.87 - 5.11 MIL/uL   Hemoglobin 11.3 (L) 12.0 - 15.0 g/dL   HCT 70.9 (L) 36 - 46 %   MCV 89.7 80.0 - 100.0 fL   MCH 30.7 26.0 - 34.0 pg   MCHC 34.2 30.0 - 36.0 g/dL   RDW 62.8 36.6 - 29.4 %   Platelets 237 150 - 400 K/uL   nRBC 0.0 0.0 - 0.2 %  Type and screen [facility]     Status: None   Collection Time: 08/27/19  9:57 PM  Result Value Ref Range   ABO/RH(D) A POS    Antibody Screen NEG    Sample Expiration      08/30/2019,2359 Performed at [facility], [address], [place], [place] [address]     Assessment : FOLASADE MOOTY is a 19 y.o. G1P0 at [redacted]w[redacted]d being admitted for PPROM. History of blood clots, previous ECMO, currently on anticoagulant therapy. Obesity in pregnancy, asthma. GBS+ (PCN allg)  Plan: Labor: Induction with Cytotec 50 mcg x 1 dose.  Then can begin augmentation with Pitocin as needed, as per protocol. Analgesia as needed.  Desires epidural. Last dose of Lovenox was ~ 8 or 9 pm yesterday. Platelets wnl. Is s/p Cardiology and Anesthesia consultation during the pregnancy with no major concerns noted.  FWB: Reassuring fetal heart tracing.  GBS positive. PCN allergy (high). No sensitivities performed. Will treat with Clindamycin.  Delivery plan: Hopeful for vaginal  delivery.     Hildred Laser, MD [facility]

## 2019-08-28 ENCOUNTER — Encounter: Payer: Self-pay | Admitting: Obstetrics and Gynecology

## 2019-08-28 DIAGNOSIS — Z9281 Personal history of extracorporeal membrane oxygenation (ECMO): Secondary | ICD-10-CM

## 2019-08-28 DIAGNOSIS — O42013 Preterm premature rupture of membranes, onset of labor within 24 hours of rupture, third trimester: Principal | ICD-10-CM

## 2019-08-28 DIAGNOSIS — Z86718 Personal history of other venous thrombosis and embolism: Secondary | ICD-10-CM

## 2019-08-28 DIAGNOSIS — Z283 Underimmunization status: Secondary | ICD-10-CM

## 2019-08-28 DIAGNOSIS — O99210 Obesity complicating pregnancy, unspecified trimester: Secondary | ICD-10-CM

## 2019-08-28 DIAGNOSIS — O09899 Supervision of other high risk pregnancies, unspecified trimester: Secondary | ICD-10-CM

## 2019-08-28 LAB — GC/CHLAMYDIA PROBE AMP
Chlamydia trachomatis, NAA: POSITIVE — AB
Neisseria Gonorrhoeae by PCR: NEGATIVE

## 2019-08-28 LAB — ABO/RH: ABO/RH(D): A POS

## 2019-08-28 LAB — RPR: RPR Ser Ql: NONREACTIVE

## 2019-08-28 MED ORDER — DIBUCAINE (PERIANAL) 1 % EX OINT: 1.0000 "application " | TOPICAL_OINTMENT | CUTANEOUS | Status: DC | PRN

## 2019-08-28 MED ORDER — FENTANYL 2.5 MCG/ML W/ROPIVACAINE 0.15% IN NS 100 ML EPIDURAL (ARMC)
12.0000 mL/h | EPIDURAL | Status: DC
  Administered 2019-08-28: 12 mL/h via EPIDURAL

## 2019-08-28 MED ORDER — PHENYLEPHRINE 40 MCG/ML (10ML) SYRINGE FOR IV PUSH (FOR BLOOD PRESSURE SUPPORT)
80.0000 ug | PREFILLED_SYRINGE | INTRAVENOUS | Status: DC | PRN
  Administered 2019-08-28: 80 ug via INTRAVENOUS
  Filled 2019-08-28: qty 10

## 2019-08-28 MED ORDER — ONDANSETRON HCL 4 MG/2ML IJ SOLN: 4.0000 mg | INTRAMUSCULAR | Status: DC | PRN

## 2019-08-28 MED ORDER — WITCH HAZEL-GLYCERIN EX PADS: 1.0000 "application " | MEDICATED_PAD | CUTANEOUS | Status: DC | PRN

## 2019-08-28 MED ORDER — AZITHROMYCIN 250 MG PO TABS
1000.0000 mg | ORAL_TABLET | Freq: Once | ORAL | Status: AC
  Administered 2019-08-28: 1000 mg via ORAL
  Filled 2019-08-28: qty 4

## 2019-08-28 MED ORDER — COCONUT OIL OIL
1.0000 "application " | TOPICAL_OIL | Status: DC | PRN
  Administered 2019-08-29: 1 via TOPICAL
  Filled 2019-08-28: qty 120

## 2019-08-28 MED ORDER — DIPHENHYDRAMINE HCL 50 MG/ML IJ SOLN: 12.5000 mg | INTRAMUSCULAR | Status: DC | PRN

## 2019-08-28 MED ORDER — VARICELLA VIRUS VACCINE LIVE 1350 PFU/0.5ML IJ SUSR
0.5000 mL | Freq: Once | INTRAMUSCULAR | Status: AC
  Administered 2019-08-30: 0.5 mL via SUBCUTANEOUS
  Filled 2019-08-28 (×2): qty 0.5

## 2019-08-28 MED ORDER — EPHEDRINE 5 MG/ML INJ
10.0000 mg | INTRAVENOUS | Status: DC | PRN
  Filled 2019-08-28: qty 2

## 2019-08-28 MED ORDER — PHENYLEPHRINE 40 MCG/ML (10ML) SYRINGE FOR IV PUSH (FOR BLOOD PRESSURE SUPPORT)
PREFILLED_SYRINGE | INTRAVENOUS | Status: AC
  Administered 2019-08-28: 80 ug via INTRAVENOUS
  Filled 2019-08-28: qty 10

## 2019-08-28 MED ORDER — PRENATAL MULTIVITAMIN CH
1.0000 | ORAL_TABLET | Freq: Every day | ORAL | Status: DC
  Administered 2019-08-28 – 2019-08-30 (×3): 1 via ORAL
  Filled 2019-08-28 (×5): qty 1

## 2019-08-28 MED ORDER — DIPHENHYDRAMINE HCL 25 MG PO CAPS: 25.0000 mg | ORAL_CAPSULE | Freq: Four times a day (QID) | ORAL | Status: DC | PRN

## 2019-08-28 MED ORDER — BENZOCAINE-MENTHOL 20-0.5 % EX AERO: 1.0000 "application " | INHALATION_SPRAY | CUTANEOUS | Status: DC | PRN

## 2019-08-28 MED ORDER — METHYLERGONOVINE MALEATE 0.2 MG/ML IJ SOLN
0.2000 mg | Freq: Once | INTRAMUSCULAR | Status: AC
  Administered 2019-08-28: 0.2 mg via INTRAMUSCULAR

## 2019-08-28 MED ORDER — ACETAMINOPHEN 325 MG PO TABS: 650.0000 mg | ORAL_TABLET | ORAL | Status: DC | PRN

## 2019-08-28 MED ORDER — ONDANSETRON HCL 4 MG PO TABS: 4.0000 mg | ORAL_TABLET | ORAL | Status: DC | PRN

## 2019-08-28 MED ORDER — LACTATED RINGERS IV SOLN
500.0000 mL | Freq: Once | INTRAVENOUS | Status: AC
  Administered 2019-08-28: 500 mL via INTRAVENOUS

## 2019-08-28 MED ORDER — IBUPROFEN 600 MG PO TABS
600.0000 mg | ORAL_TABLET | Freq: Four times a day (QID) | ORAL | Status: DC
  Administered 2019-08-28 – 2019-08-30 (×9): 600 mg via ORAL
  Filled 2019-08-28 (×10): qty 1

## 2019-08-28 MED ORDER — METHYLERGONOVINE MALEATE 0.2 MG/ML IJ SOLN
INTRAMUSCULAR | Status: AC
  Filled 2019-08-28: qty 1

## 2019-08-28 MED ORDER — SENNOSIDES-DOCUSATE SODIUM 8.6-50 MG PO TABS
2.0000 | ORAL_TABLET | ORAL | Status: DC
  Filled 2019-08-28: qty 2

## 2019-08-28 MED ORDER — ZOLPIDEM TARTRATE 5 MG PO TABS: 5.0000 mg | ORAL_TABLET | Freq: Every evening | ORAL | Status: DC | PRN

## 2019-08-28 MED ORDER — LIDOCAINE HCL (PF) 1 % IJ SOLN
INTRAMUSCULAR | Status: DC | PRN
  Administered 2019-08-27: 4 mL via SUBCUTANEOUS

## 2019-08-28 MED ORDER — SIMETHICONE 80 MG PO CHEW: 80.0000 mg | CHEWABLE_TABLET | ORAL | Status: DC | PRN

## 2019-08-28 MED ORDER — LIDOCAINE-EPINEPHRINE (PF) 1.5 %-1:200000 IJ SOLN
INTRAMUSCULAR | Status: DC | PRN
  Administered 2019-08-27: 4 mL via EPIDURAL

## 2019-08-28 MED ORDER — BUPIVACAINE HCL (PF) 0.25 % IJ SOLN
INTRAMUSCULAR | Status: DC | PRN
  Administered 2019-08-27: 5 mL via EPIDURAL
  Administered 2019-08-28: 3 mL via EPIDURAL

## 2019-08-28 NOTE — Anesthesia Procedure Notes (Signed)
Epidural Patient location during procedure: OB  Staffing Performed: anesthesiologist   Preanesthetic Checklist Completed: patient identified, IV checked, site marked, risks and benefits discussed, surgical consent, monitors and equipment checked, pre-op evaluation and timeout performed  Epidural Patient position: sitting Prep: Betadine Patient monitoring: heart rate, continuous pulse ox and blood pressure Approach: midline Location: L4-L5 Injection technique: LOR saline  Needle:  Needle type: Tuohy  Needle gauge: 17 G Needle length: 9 cm and 9 Needle insertion depth: 9 cm Catheter type: closed end flexible Catheter size: 19 Gauge Catheter at skin depth: 15 cm Test dose: negative and 1.5% lidocaine with Epi 1:200 K  Assessment Sensory level: T10 Events: blood not aspirated, injection not painful, no injection resistance, no paresthesia and negative IV test  Additional Notes   Patient tolerated the insertion well without complications.-SATD -IVTD. No paresthesia. Refer to [facility] nursing for VS and dosingReason for block:procedure for pain

## 2019-08-28 NOTE — Progress Notes (Signed)
Pt dermatomes at T4 level bilaterally assessed by Melina Copa. RN called Dr Pernell Dupre and given verbal order to stop epidural for 20-30 minutes and restart at 33mL/hr. Order stated back to provider and verified. Pt epidural stopped and sitting straight up. Pt denies any further needs at this time.

## 2019-08-28 NOTE — Plan of Care (Signed)
  Problem: Education: Goal: Knowledge of condition will improve Outcome: Progressing   

## 2019-08-28 NOTE — Anesthesia Preprocedure Evaluation (Signed)
Anesthesia Evaluation  Patient identified by MRN, date of birth, ID band Patient awake    Reviewed: Allergy & Precautions, H&P , NPO status , Patient's Chart, lab work & pertinent test results, reviewed documented beta blocker date and time   Airway Mallampati: II  TM Distance: >3 FB Neck ROM: full    Dental no notable dental hx. (+) Teeth Intact   Pulmonary asthma , Current Smoker,    Pulmonary exam normal breath sounds clear to auscultation       Cardiovascular Exercise Tolerance: Good negative cardio ROS   Rhythm:regular Rate:Normal     Neuro/Psych Pt had last dose of lovenox over 24 hours ago.  Risks of bleeding discussed with patient.  JA negative neurological ROS  negative psych ROS   GI/Hepatic negative GI ROS, Neg liver ROS,   Endo/Other  negative endocrine ROSdiabetes, Gestational  Renal/GU      Musculoskeletal   Abdominal   Peds  Hematology negative hematology ROS (+)   Anesthesia Other Findings   Reproductive/Obstetrics (+) Pregnancy                             Anesthesia Physical Anesthesia Plan  ASA: III  Anesthesia Plan: Epidural   Post-op Pain Management:    Induction:   PONV Risk Score and Plan:   Airway Management Planned:   Additional Equipment:   Intra-op Plan:   Post-operative Plan:   Informed Consent: I have reviewed the patients History and Physical, chart, labs and discussed the procedure including the risks, benefits and alternatives for the proposed anesthesia with the patient or authorized representative who has indicated his/her understanding and acceptance.       Plan Discussed with:   Anesthesia Plan Comments:         Anesthesia Quick Evaluation

## 2019-08-29 LAB — CBC
HCT: 25.9 % — ABNORMAL LOW (ref 36.0–46.0)
Hemoglobin: 9.2 g/dL — ABNORMAL LOW (ref 12.0–15.0)
MCH: 31.3 pg (ref 26.0–34.0)
MCHC: 35.5 g/dL (ref 30.0–36.0)
MCV: 88.1 fL (ref 80.0–100.0)
Platelets: 205 10*3/uL (ref 150–400)
RBC: 2.94 MIL/uL — ABNORMAL LOW (ref 3.87–5.11)
RDW: 13.7 % (ref 11.5–15.5)
WBC: 15.9 10*3/uL — ABNORMAL HIGH (ref 4.0–10.5)
nRBC: 0 % (ref 0.0–0.2)

## 2019-08-29 MED ORDER — DOCUSATE SODIUM 100 MG PO CAPS
100.0000 mg | ORAL_CAPSULE | Freq: Two times a day (BID) | ORAL | 2 refills | Status: DC | PRN
Start: 2019-08-29 — End: 2022-09-27

## 2019-08-29 MED ORDER — FERROUS SULFATE 325 (65 FE) MG PO TABS: 325.0000 mg | ORAL_TABLET | Freq: Two times a day (BID) | ORAL | 1 refills | Status: DC

## 2019-08-29 MED ORDER — IBUPROFEN 800 MG PO TABS
800.0000 mg | ORAL_TABLET | Freq: Three times a day (TID) | ORAL | 1 refills | Status: DC | PRN
Start: 2019-08-29 — End: 2019-11-02

## 2019-08-29 NOTE — Lactation Note (Signed)
This note was copied from a baby's chart. Lactation Consultation Note  Patient Name: Briana Cruz Date: 08/29/2019 Reason for consult: Follow-up assessment;Mother's request;Difficult latch;Primapara;Late-preterm 34-36.6wks;Infant < 6lbs;Other (Comment) (Mom just pumping & hand expressing & bottlefeeding)  Mom has struggled with latching Yasin to the breast since birth.  Mom has extremely large pendulous breasts.  Mom has everted nipples, but has difficulty seeing them to sandwich them for a good deep latch.  I have assisted with every feeding during the day.  Mom is still committed to give her breast milk, but has decided to just pump and give with a bottle for now.  Mom has been hand expressing copious amounts of colostrum into 11 ml vial since the beginning.  Can hand express (even spraying) triple the milk that she can pump.  Reviewed breast massage, hand expression, pumping, collection, storage, labeling, cleaning and handling of expressed milk.  Encouraged mom to pump every 2 to 3 hours to bring in mature milk, ensure a plentiful milk supply and prevent engorgement.  Rented mom Symphony pump #[id number] through [facility] for 1 week until can get [facility] loaner.  Referral sent to [facility] for mom to get DEBP.  Reviewed normal newborn stomach size, adequate intake and output, supply and demand, normal course of lactation and routine newborn feeding patterns.  Lactation Government social research officer given with contact numbers and reviewed.  Lactation name and number has been written on white board and encouraged to call with any questions, concerns or assistance.       Maternal Data Formula Feeding for Exclusion: No Has patient been taught Hand Expression?: Yes Does the patient have breastfeeding experience prior to this delivery?: No (Gr1)  Feeding Feeding Type: Bottle Fed - Formula  LATCH Score                   Interventions Interventions: Hand express;Pre-pump if needed;Expressed  milk;Coconut oil;DEBP  Lactation Tools Discussed/Used Tools: Pump;Coconut oil;Bottle Nipple shield size: 20 Breast pump type: Double-Electric Breast Pump WIC Program: Yes Pump Review: Setup, frequency, and cleaning;Milk Storage;Other (comment) Initiated by:: S.Bastien Strawser,RN,BSN,IBCLC Date initiated:: 08/29/19   Consult Status Consult Status: Follow-up Follow-up type: Call as needed    Briana Cruz 08/29/2019, 9:33 PM

## 2019-08-29 NOTE — Anesthesia Postprocedure Evaluation (Signed)
Anesthesia Post Note  Patient: Briana Cruz  Procedure(s) Performed: AN AD HOC LABOR EPIDURAL  Patient location during evaluation: Mother Baby Anesthesia Type: Epidural Level of consciousness: awake and alert Pain management: pain level controlled Vital Signs Assessment: post-procedure vital signs reviewed and stable Respiratory status: spontaneous breathing, nonlabored ventilation and respiratory function stable Cardiovascular status: stable Postop Assessment: no headache, no backache, epidural receding and able to ambulate Anesthetic complications: no   No complications documented.   Last Vitals:  Vitals:   08/28/19 1919 08/28/19 2316  BP: (!) 101/56 (!) 96/51  Pulse: 97 89  Resp:  18  Temp: 36.4 C 36.8 C  SpO2: 100% 100%    Last Pain:  Vitals:   08/28/19 2316  TempSrc: Oral  PainSc:                  Corinda Gubler

## 2019-08-29 NOTE — Discharge Summary (Addendum)
Postpartum Discharge Summary     Patient Name: Briana Cruz DOB: [birth year] MRN: [id number]  Date of admission: 08/27/2019 Delivery date:08/28/2019  Delivering provider: Rubie Maid  Date of discharge: 08/30/2019  Admitting diagnosis: Preterm premature rupture of membranes (PPROM) with onset of labor within 24 hours of rupture in third trimester, antepartum [O42.013] Intrauterine pregnancy: [redacted]w[redacted]d    Secondary diagnosis:  Active Problems:   Susceptible to varicella (non-immune), currently pregnant   Obesity in pregnancy   History of blood clots   Personal history of ECMO   Chlamydia trachomatis infection in pregnancy in third trimester  Additional problems: Chlamydia infection on admission, GBS positive    Discharge diagnosis: Preterm Pregnancy Delivered                                              Post partum procedures:None Augmentation: Cytotec Complications: None  Hospital course: Induction of Labor With Vaginal Delivery   19y.o. yo G1P0101 at 373w5das admitted to the hospital 08/27/2019 for induction of labor.  Indication for induction: PPROM.  Patient had an uncomplicated labor course as follows: Membrane Rupture Time/Date: 8:00 PM ,08/27/2019   Delivery Method:Vaginal, Spontaneous  Episiotomy: None  Lacerations:  None  Details of delivery can be found in separate delivery note.  Patient had a routine postpartum course. She was treated with PO Azithromycin on PPD#0. Patient is discharged home 08/30/19.  Newborn Data: Birth date:08/28/2019  Birth time:4:40 AM  Gender:Female  Living status:Living  Apgars:8 ,9  Weight:2360 g   Magnesium Sulfate received: No BMZ received: No Rhophylac:No MMR:No  Varivax: Yes T-DaP:Given prenatally Flu: No Transfusion:No  Physical exam  Vitals:   08/28/19 1521 08/28/19 1919 08/28/19 2316 08/29/19 0815  BP: (!) 99/51 (!) 101/56 (!) 96/51 114/68  Pulse: 75 97 89 78  Resp:   18 20  Temp: 98.5 F (36.9 C) 97.6 F (36.4 C) 98.3  F (36.8 C) 98.2 F (36.8 C)  TempSrc: Oral Axillary Oral Oral  SpO2: 99% 100% 100% 100%  Weight:      Height:       General: alert and no distress Lochia: appropriate Uterine Fundus: firm Incision: N/A DVT Evaluation: No evidence of DVT seen on physical exam. Negative Homan's sign. No cords or calf tenderness. No significant calf/ankle edema.   Labs: Lab Results  Component Value Date   WBC 15.9 (H) 08/29/2019   HGB 9.2 (L) 08/29/2019   HCT 25.9 (L) 08/29/2019   MCV 88.1 08/29/2019   PLT 205 08/29/2019    Edinburgh Score: Edinburgh Postnatal Depression Scale Screening Tool 08/29/2019  I have been able to laugh and see the funny side of things. 1  I have looked forward with enjoyment to things. 0  I have blamed myself unnecessarily when things went wrong. 2  I have been anxious or worried for no good reason. 2  I have felt scared or panicky for no good reason. 0  Things have been getting on top of me. 1  I have been so unhappy that I have had difficulty sleeping. 0  I have felt sad or miserable. 1  I have been so unhappy that I have been crying. 0  The thought of harming myself has occurred to me. 0  Edinburgh Postnatal Depression Scale Total 7     After visit meds:  Allergies as of  08/30/2019      Reactions   Penicillins Other (See Comments)   Rxn to PCN @ 19 yo. Notes "kidneys shut down".       Medication List    STOP taking these medications   enoxaparin 40 MG/0.4ML injection Commonly known as: LOVENOX     TAKE these medications   docusate sodium 100 MG capsule Commonly known as: COLACE Take 1 capsule (100 mg total) by mouth 2 (two) times daily as needed.   ferrous sulfate 325 (65 FE) MG tablet Commonly known as: FerrouSul Take 1 tablet (325 mg total) by mouth 2 (two) times daily with a meal. What changed: when to take this   ibuprofen 800 MG tablet Commonly known as: ADVIL Take 1 tablet (800 mg total) by mouth every 8 (eight) hours as needed.    prenatal multivitamin Tabs tablet Take 1 tablet by mouth daily at 12 noon.   ProAir HFA 108 (90 Base) MCG/ACT inhaler Generic drug: albuterol INHALE 1 TO 2 PUFFS BY MOUTH INTO THE LUNGS EVERY 4 HOURS AS NEEDED        Discharge home in stable condition Infant Feeding: Breast Infant Disposition:home with mother Discharge instruction: per After Visit Summary and Postpartum booklet. Activity: Advance as tolerated. Pelvic rest for 6 weeks.  Diet: routine diet Anticipated Birth Control: IUD Postpartum Appointment:6 weeks  Additional Postpartum F/U: Postpartum Depression checkup  In 2 weeks.  Future Appointments: Future Appointments  Date Time Provider [employer]  09/01/2019  8:45 AM [facility] NST ROOM [facility] None  09/01/2019  9:45 AM Harlin Heys, MD [facility] None   Follow up Visit:     08/29/2019 Rubie Maid, MD  [facility]

## 2019-08-29 NOTE — Progress Notes (Signed)
Post Partum Day # 1, s/p SVD  Subjective: no complaints, up ad lib, voiding and tolerating PO. Pain is well controlled. Breastfeeding.   Objective: Temp:  [97.6 F (36.4 C)-99 F (37.2 C)] 98.2 F (36.8 C) (07/04 0815) Pulse Rate:  [75-97] 78 (07/04 0815) Resp:  [18-20] 20 (07/04 0815) BP: (96-114)/(51-68) 114/68 (07/04 0815) SpO2:  [98 %-100 %] 100 % (07/04 0815)  Physical Exam:  General: alert and no distress  Lungs: clear to auscultation bilaterally Breasts: normal appearance, no masses or tenderness Heart: regular rate and rhythm, S1, S2 normal, no murmur, click, rub or gallop Abdomen: soft, non-tender; bowel sounds normal; no masses,  no organomegaly Pelvis: Lochia: appropriate, Uterine Fundus: firm Extremities: DVT Evaluation: No evidence of DVT seen on physical exam. Negative Homan's sign. No cords or calf tenderness. No significant calf/ankle edema.  Recent Labs    08/27/19 2157 08/29/19 0547  HGB 11.3* 9.2*  HCT 33.0* 25.9*    Assessment/Plan: Doing well postpartum Breastfeeding (pumping), Lactation consult  Contraception Mirena IUD Chlamydia testing positive on admission, treated with Azithromycin. Will need partner testing/treatment. Will also need TOC postpartum.  Anemia postpartum, will treat with PO iron.  Plan for discharge tomorrow.   LOS: 2 days   Hildred Laser, MD [facility] 08/29/2019 10:32 AM

## 2019-08-30 LAB — STREP GP B NAA+RFLX: Strep Gp B NAA+Rflx: POSITIVE — AB

## 2019-08-30 LAB — STREP GP B SUSCEPTIBILITY

## 2019-08-30 NOTE — Progress Notes (Signed)
Pt discharged with infant. Discharge instructions, prescriptions, and follow up appointments given to and reviewed with patient. Pt verbalized understanding. Escorted out by auxillary.  

## 2019-08-30 NOTE — Lactation Note (Addendum)
This note was copied from a baby's chart. Lactation Consultation Note  Patient Name: [name] [id number] Date: 08/30/2019 Reason for consult: Follow-up assessment;Primapara;Late-preterm 34-36.6wks   Maternal Data Formula Feeding for Exclusion: No Has patient been taught Hand Expression?: Yes Does the patient have breastfeeding experience prior to this delivery?: No Encouraged  Using breast pump to remove milk and stimulate milk production every 3 hrs, if no or little milk expressed, use hand expression, increase breast milk / formula amts to 20 - 30 cc as baby late preterm and wt 5.3lbs, increase if baby not satisfied at a feeding, has ped appt tomorrow.  Feeding Feeding Type: Breast Milk with Formula added Nipple Type: Slow - flow  LATCH Score                   Interventions Interventions: DEBP  Lactation Tools Discussed/Used WIC Program: Yes Encouraged pumping breasts every 3 hrs to remove milk and encourage milk production along with hand expression.   See WIC about obtaining an electric breast pump, then return [facility] pump.  Consult Status Consult Status: Complete Date: 08/30/19 Follow-up type: In-patient    [name] 08/30/2019, 11:35 AM

## 2019-08-31 ENCOUNTER — Telehealth: Payer: Self-pay | Admitting: Obstetrics and Gynecology

## 2019-08-31 NOTE — Telephone Encounter (Signed)
Called patient to set her up for her ppv's. Left message for patient to call the office.

## 2019-09-01 ENCOUNTER — Encounter: Payer: Medicaid Other | Admitting: Obstetrics and Gynecology

## 2019-09-01 ENCOUNTER — Other Ambulatory Visit: Payer: Medicaid Other

## 2019-09-01 NOTE — Telephone Encounter (Signed)
Pt called in and stated that her breast pump gave out.  The pt was calling in and stated that she needs a new one. She stated she called the place that you rent them from and they aren't answering and the hospital isnt helping her. The pt is stating that she needs to pump her breast are hurting. The pt is requesting a call back. Please advise

## 2019-09-02 NOTE — Telephone Encounter (Signed)
Called pt couldn't leave message to return call.

## 2019-09-09 ENCOUNTER — Encounter: Payer: Self-pay | Admitting: Obstetrics and Gynecology

## 2019-09-09 ENCOUNTER — Other Ambulatory Visit: Payer: Self-pay

## 2019-09-09 ENCOUNTER — Ambulatory Visit (INDEPENDENT_AMBULATORY_CARE_PROVIDER_SITE_OTHER): Payer: Medicaid Other | Admitting: Obstetrics and Gynecology

## 2019-09-09 DIAGNOSIS — G4489 Other headache syndrome: Secondary | ICD-10-CM | POA: Diagnosis not present

## 2019-09-09 NOTE — Progress Notes (Signed)
Virtual Visit via Telephone Note  I connected with Briana Cruz on 09/09/19 at 15:15 AM EDT by telephone and verified that I am speaking with the correct person using two identifiers.   I discussed the limitations, risks, security and privacy concerns of performing an evaluation and management service by telephone and the availability of in person appointments. I also discussed with the patient that there may be a patient responsible charge related to this service. The patient expressed understanding and agreed to proceed.   History of Present Illness: Briana Cruz is a 19 y.o. G67P0101 female who was contacted for a 2 week postpartum televisit. She reports that overall she is doing well. Enjoying being a new mother. She does report difficulties with latching, so is pumping (feels more comfortable with this).  Notes good milk supply, 8 oz/per pump session.  Feeds are q 3-4 hrs. Postpartum depression screen is negative. EPDS = 6.  Shalisa states that she has good support at home. She does complain of headaches since delivery, Tylenol helps some.  The headaches are not really related to movement/changing positions. .    Observations/Objective: Currently breastfeeding/pumping. No LMP.  Height: 5'4.  Patient with unknown weight, states she has not weighed herself recently.    Assessment and Plan: Overall doing well postpartum.  Continued to encourage breast pumping (notes she is more comfortable with this, declines referral to lactation consultant).  Headaches, unclear cause.  Could be related to epidural, or other cause (migraines, postpartum HTN/pre-eclampsia). No other symptoms noted.  Advised to follow up Monday in office for BP check. Also discussed use of caffiene to help headaches. Continue to encourage breastfeeding.  Follow up in 4 weeks for postpartum visit.   Follow Up Instructions:  I discussed the assessment and treatment plan with the patient. The patient was provided an  opportunity to ask questions and all were answered. The patient agreed with the plan and demonstrated an understanding of the instructions.   The patient was advised to call back or seek an in-person evaluation if the symptoms worsen or if the condition fails to improve as anticipated.  I provided 11 minutes of non-face-to-face time during this encounter.   Hildred Laser, MD [facility]

## 2019-09-24 ENCOUNTER — Other Ambulatory Visit (HOSPITAL_COMMUNITY)
Admission: RE | Admit: 2019-09-24 | Discharge: 2019-09-24 | Disposition: A | Discharge status: Home / Self Care | Payer: Medicaid Other | Source: Ambulatory Visit | Attending: Obstetrics and Gynecology | Admitting: Obstetrics and Gynecology

## 2019-09-24 ENCOUNTER — Ambulatory Visit (INDEPENDENT_AMBULATORY_CARE_PROVIDER_SITE_OTHER): Payer: Medicaid Other | Admitting: Obstetrics and Gynecology

## 2019-09-24 ENCOUNTER — Encounter: Payer: Self-pay | Admitting: Obstetrics and Gynecology

## 2019-09-24 DIAGNOSIS — Z8619 Personal history of other infectious and parasitic diseases: Secondary | ICD-10-CM

## 2019-09-24 DIAGNOSIS — O9081 Anemia of the puerperium: Secondary | ICD-10-CM

## 2019-09-24 NOTE — Progress Notes (Signed)
   OBSTETRICS POSTPARTUM CLINIC PROGRESS NOTE  Subjective:     Briana Cruz is a 19 y.o. G43P0101 female who presents for a postpartum visit. She is 6 weeks postpartum following a spontaneous vaginal delivery. I have fully reviewed the prenatal and intrapartum course. The delivery was at 36.5 gestational weeks PROM).  Anesthesia: epidural. Postpartum course has been well. Baby's course has been well. Baby is feeding by bottle - formula (cannot recall name). Bleeding: patient has not resumed menses, with No LMP recorded. Bowel function is normal. Bladder function is normal. Patient is sexually active. Contraception method desired is Nexplanon. Postpartum depression screening: negative (EPDS = 8).   Patient also with h/o Chlamydia at time of delivery. Was treated inpatient.  States FOB said he also got treated but she is unsure if this is true, as she is still feeling some abdominal pain again. Had intercourse with him (unprotected) 2-3 days ago. Desires re-screening.   The following portions of the patient's history were reviewed and updated as appropriate: allergies, current medications, past family history, past medical history, past social history, past surgical history and problem list.  Review of Systems Pertinent items noted in HPI and remainder of comprehensive ROS otherwise negative.   Objective:    BP 112/74   Pulse 72   Ht 5\' 4"  (1.626 m)   Wt (!) 252 lb 1.6 oz (114.4 kg)   Breastfeeding No   BMI 43.27 kg/m   General:  alert and no distress   Breasts:  inspection negative, no nipple discharge or bleeding, no masses or nodularity palpable  Lungs: clear to auscultation bilaterally  Heart:  regular rate and rhythm, S1, S2 normal, no murmur, click, rub or gallop  Abdomen: soft, non-tender; bowel sounds normal; no masses,  no organomegaly.     Vulva:  normal  Vagina: normal vagina, no discharge, exudate, lesion, or erythema  Cervix:  no cervical motion tenderness and no lesions    Corpus: normal size, contour, position, consistency, mobility, non-tender  Adnexa:  normal adnexa and no mass, fullness, tenderness  Rectal Exam: Not performed.         Labs:  Lab Results  Component Value Date   HGB 9.2 (L) 08/29/2019     Assessment:   1. Postpartum care following vaginal delivery   2. History of chlamydia infection   3. Postpartum anemia     Plan:   1. Contraception: Nexplanon, desires to have placed next week.  2. Will check Hgb for h/o anemia.  3. Patient desires retesting for GC/CT.  Offered full STD panel but patient declines.  Advised that if positive, would recommend abstinence for full 7 days until after both partners are treated. Encouraged safe sex practices.  Follow up in: 1 week for Nexplanon insertion.  Follow up on 3 months for physical exam.   10/30/2019, MD [facility]

## 2019-09-24 NOTE — Progress Notes (Signed)
   PT is present today for her postpartum visit. Pt stated that she is not breastfeeding and have had sexually intercourse recently; condom broke. Pt is requesting STD screening due to having abd pains. Pt stated that she would like to get the Nexplanon for birth control. EPDS= 8.  Pt stated that she is doing well no complaints.

## 2019-09-24 NOTE — Patient Instructions (Signed)

## 2019-09-28 LAB — CERVICOVAGINAL ANCILLARY ONLY
Chlamydia: NEGATIVE
Comment: NEGATIVE
Comment: NEGATIVE
Comment: NORMAL
Neisseria Gonorrhea: NEGATIVE
Trichomonas: POSITIVE — AB

## 2019-09-28 MED ORDER — METRONIDAZOLE 500 MG PO TABS: ORAL_TABLET | ORAL | 1 refills | Status: DC

## 2019-09-28 NOTE — Addendum Note (Signed)
Addended by: Fabian November on: 09/28/2019 03:37 PM   Modules accepted: Orders

## 2019-09-29 ENCOUNTER — Ambulatory Visit: Payer: Medicaid Other | Admitting: Obstetrics and Gynecology

## 2019-10-06 ENCOUNTER — Ambulatory Visit (INDEPENDENT_AMBULATORY_CARE_PROVIDER_SITE_OTHER): Payer: Medicaid Other | Admitting: Obstetrics and Gynecology

## 2019-10-06 ENCOUNTER — Encounter: Payer: Self-pay | Admitting: Obstetrics and Gynecology

## 2019-10-06 VITALS — BP 89/60 | HR 75 | Ht 64.0 in | Wt 243.0 lb

## 2019-10-06 DIAGNOSIS — Z30017 Encounter for initial prescription of implantable subdermal contraceptive: Secondary | ICD-10-CM

## 2019-10-06 LAB — POCT URINE PREGNANCY: Preg Test, Ur: NEGATIVE

## 2019-10-06 NOTE — Patient Instructions (Signed)
Nexplanon Instructions After Insertion  Keep bandage clean and dry for 24 hours  May use ice/Tylenol/Ibuprofen for soreness or pain  If you develop fever, drainage or increased warmth from incision site-contact office immediately   

## 2019-10-06 NOTE — Progress Notes (Signed)
    GYNECOLOGY OFFICE PROCEDURE NOTE  Briana Cruz is a 19 y.o. G1P0101 here for Nexplanon insertion.  No other gynecologic concerns.  Nexplanon Insertion Procedure Patient identified, informed consent performed, consent signed.   Patient does understand that irregular bleeding is a very common side effect of this medication. She was advised to have backup contraception for one week after placement. Pregnancy test in clinic today was negative.  Appropriate time out taken.  Patient's left arm was prepped and draped in the usual sterile fashion. The ruler used to measure and mark insertion area.  Patient was prepped with alcohol swab and then injected with 3 ml of 1% lidocaine.  She was prepped with betadine, Nexplanon removed from packaging,  Device confirmed in needle, then inserted full length of needle and withdrawn per handbook instructions. Nexplanon was able to palpated in the patient's arm; patient palpated the insert herself. There was minimal blood loss.  Patient insertion site covered with guaze and a pressure bandage to reduce any bruising.  The patient tolerated the procedure well and was given post procedure instructions.    NDC: 7711-6579-03 Exp: 11/23/2021 Lot: [id number]

## 2019-10-06 NOTE — Progress Notes (Signed)
Pt present for Nexplanon insertion. Pt's upt neg. Pt stated that she was doing well no problems.

## 2019-10-27 ENCOUNTER — Encounter: Payer: Medicaid Other | Admitting: Obstetrics and Gynecology

## 2019-11-02 ENCOUNTER — Ambulatory Visit (INDEPENDENT_AMBULATORY_CARE_PROVIDER_SITE_OTHER): Payer: Medicaid Other | Admitting: Obstetrics and Gynecology

## 2019-11-02 ENCOUNTER — Other Ambulatory Visit: Payer: Self-pay

## 2019-11-02 ENCOUNTER — Encounter: Payer: Self-pay | Admitting: Obstetrics and Gynecology

## 2019-11-02 ENCOUNTER — Other Ambulatory Visit (HOSPITAL_COMMUNITY)
Admission: RE | Admit: 2019-11-02 | Discharge: 2019-11-02 | Disposition: A | Discharge status: Home / Self Care | Payer: Medicaid Other | Source: Ambulatory Visit | Attending: Obstetrics and Gynecology | Admitting: Obstetrics and Gynecology

## 2019-11-02 VITALS — BP 99/67 | HR 73 | Ht 64.0 in | Wt 245.6 lb

## 2019-11-02 DIAGNOSIS — Z975 Presence of (intrauterine) contraceptive device: Secondary | ICD-10-CM

## 2019-11-02 DIAGNOSIS — A599 Trichomoniasis, unspecified: Secondary | ICD-10-CM

## 2019-11-02 HISTORY — DX: Trichomoniasis, unspecified: A59.9

## 2019-11-02 MED ORDER — IBUPROFEN 800 MG PO TABS: 800.0000 mg | ORAL_TABLET | Freq: Three times a day (TID) | ORAL | 1 refills | Status: AC | PRN

## 2019-11-02 NOTE — Progress Notes (Signed)
Pt present for TOC/Nexplanon check. Pt stated that she was doing well no problems.

## 2019-11-02 NOTE — Progress Notes (Signed)
    GYNECOLOGY PROGRESS NOTE  Subjective:    Patient ID: Briana Cruz, female    DOB: 09-03-2000, 19 y.o.   MRN: [id number]  HPI  Patient is a 19 y.o. G88P0101 female who presents for [facility] for recent trichomonas infection. Patient reports compliance with treatment, also notes that partner was treated as well. Intercourse during treatment period. Denies complaints today. Also reports that she is doing well with her Nexplanon, which was inserted last month. Notes still having a cycle this month (currently on it now).    The following portions of the patient's history were reviewed and updated as appropriate: allergies, current medications, past family history, past medical history, past social history, past surgical history and problem list.  Review of Systems Pertinent items noted in HPI and remainder of comprehensive ROS otherwise negative.   Objective:   Blood pressure 99/67, pulse 73, height 5\' 4"  (1.626 m), weight 245 lb 9.6 oz (111.4 kg), not currently breastfeeding. Body mass index is 42.16 kg/m.   General appearance: alert and no distress Abdomen: soft, non-tender; bowel sounds normal; no masses,  no organomegaly Pelvic: external genitalia normal with small amount of blood at introitus.  Speculum exam not performed.   Assessment:   1. Trichomoniasis   2. Nexplanon in place     Plan:   1. Trichomonas infection recently treated. Will perform test of cure today. Encourage safe sex practices.  2. Nexplanon in place. Patient has no complaints time. 3. Return to clinic for any scheduled appointments or for any gynecologic concerns as needed.   , MD [facility]

## 2019-11-04 LAB — CERVICOVAGINAL ANCILLARY ONLY
Chlamydia: NEGATIVE
Comment: NEGATIVE
Comment: NEGATIVE
Comment: NORMAL
Neisseria Gonorrhea: NEGATIVE
Trichomonas: NEGATIVE

## 2020-02-09 ENCOUNTER — Encounter: Payer: Medicaid Other | Admitting: Obstetrics and Gynecology

## 2020-02-09 NOTE — Progress Notes (Deleted)
Pt present for annual exam.  

## 2020-02-16 ENCOUNTER — Encounter: Payer: Self-pay | Admitting: Obstetrics and Gynecology

## 2022-03-11 NOTE — Progress Notes (Deleted)
GYNECOLOGY ANNUAL PHYSICAL EXAM PROGRESS NOTE  Subjective:    Briana Cruz is a 22 y.o. G42P0101 female who presents for an annual exam. The patient has no complaints today. The patient {is/is not/has never been:13135} sexually active. The patient participates in regular exercise: {yes/no/not asked:9010}. Has the patient ever been transfused or tattooed?: {yes/no/not asked:9010}. The patient reports that there {is/is not:9024} domestic violence in her life.    Menstrual History: Menarche age: *** No LMP recorded. Patient has had an implant.     Gynecologic History:  Contraception: Nexplanon History of STI's: Denies Last Pap: ***. Results were: {norm/abn:16337}.  ***Denies/Notes h/o abnormal pap smears.   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   Septic shock ([facility] 07/05/2014   Trichomoniasis 11/02/2019    Past Surgical History:  Procedure Laterality Date   CARDIAC SURGERY     EXTRACORPOREAL CIRCULATION     OTHER SURGICAL HISTORY     Facial drain   WISDOM TOOTH EXTRACTION      Family History  Problem Relation Age of Onset   Healthy Mother    Healthy Father    Healthy Brother    Congestive Heart Failure Maternal Grandmother    Kidney disease Maternal Grandmother    Diabetes Maternal Grandmother    Hypertension Maternal Grandmother    Diabetes Maternal Grandfather    Gout Maternal Grandfather     Social  History   Socioeconomic History   Marital status: Single    Spouse name: Not on file   Number of children: Not on file   Years of education: Not on file   Highest education level: Not on file  Occupational History   Not on file  Tobacco Use   Smoking status: Never   Smokeless tobacco: Never  Vaping Use   Vaping Use: Never used  Substance and Sexual Activity   Alcohol use: No   Drug use: Not Currently    Types: Marijuana   Sexual activity: Not Currently    Birth control/protection: None, Implant  Other Topics Concern   Not on file  Social History Narrative   Not on file   Social Determinants of Health   Financial Resource Strain: Not on file  Food Insecurity: Not on file  Transportation Needs: Not on file  Physical Activity: Not on file  Stress: Not on file  Social Connections: Not on file  Intimate Partner Violence: Not on file    Current Outpatient Medications on File Prior to Visit  Medication Sig Dispense Refill   docusate sodium (COLACE) 100 MG capsule Take 1 capsule (100 mg total) by mouth 2 (two) times daily as needed. 30 capsule 2   ferrous sulfate (FERROUSUL) 325 (65 FE) MG tablet Take 1 tablet (325 mg total)  by mouth 2 (two) times daily with a meal. 60 tablet 1   ibuprofen (ADVIL) 800 MG tablet Take 1 tablet (800 mg total) by mouth every 8 (eight) hours as needed for mild pain. 30 tablet 1   Prenatal Vit-Fe Fumarate-FA (PRENATAL MULTIVITAMIN) TABS tablet Take 1 tablet by mouth daily at 12 noon.     PROAIR HFA 108 (90 Base) MCG/ACT inhaler INHALE 1 TO 2 PUFFS BY MOUTH INTO THE LUNGS EVERY 4 HOURS AS NEEDED 8.5 g 1   No current facility-administered medications on file prior to visit.    Allergies  Allergen Reactions   Penicillins Other (See Comments)    Rxn to PCN @ 22 yo. Notes "kidneys shut down".      Review of Systems Constitutional: negative for chills, fatigue, fevers and sweats Eyes: negative for irritation, redness and visual disturbance Ears,  nose, mouth, throat, and face: negative for hearing loss, nasal congestion, snoring and tinnitus Respiratory: negative for asthma, cough, sputum Cardiovascular: negative for chest pain, dyspnea, exertional chest pressure/discomfort, irregular heart beat, palpitations and syncope Gastrointestinal: negative for abdominal pain, change in bowel habits, nausea and vomiting Genitourinary: negative for abnormal menstrual periods, genital lesions, sexual problems and vaginal discharge, dysuria and urinary incontinence Integument/breast: negative for breast lump, breast tenderness and nipple discharge Hematologic/lymphatic: negative for bleeding and easy bruising Musculoskeletal:negative for back pain and muscle weakness Neurological: negative for dizziness, headaches, vertigo and weakness Endocrine: negative for diabetic symptoms including polydipsia, polyuria and skin dryness Allergic/Immunologic: negative for hay fever and urticaria      Objective:  There were no vitals taken for this visit. There is no height or weight on file to calculate BMI.    General Appearance:    Alert, cooperative, no distress, appears stated age  Head:    Normocephalic, without obvious abnormality, atraumatic  Eyes:    PERRL, conjunctiva/corneas clear, EOM's intact, both eyes  Ears:    Normal external ear canals, both ears  Nose:   Nares normal, septum midline, mucosa normal, no drainage or sinus tenderness  Throat:   Lips, mucosa, and tongue normal; teeth and gums normal  Neck:   Supple, symmetrical, trachea midline, no adenopathy; thyroid: no enlargement/tenderness/nodules; no carotid bruit or JVD  Back:     Symmetric, no curvature, ROM normal, no CVA tenderness  Lungs:     Clear to auscultation bilaterally, respirations unlabored  Chest Wall:    No tenderness or deformity   Heart:    Regular rate and rhythm, S1 and S2 normal, no murmur, rub or gallop  Breast Exam:    No tenderness, masses, or nipple abnormality   Abdomen:     Soft, non-tender, bowel sounds active all four quadrants, no masses, no organomegaly.    Genitalia:    Pelvic:external genitalia normal, vagina without lesions, discharge, or tenderness, rectovaginal septum  normal. Cervix normal in appearance, no cervical motion tenderness, no adnexal masses or tenderness.  Uterus normal size, shape, mobile, regular contours, nontender.  Rectal:    Normal external sphincter.  No hemorrhoids appreciated. Internal exam not done.   Extremities:   Extremities normal, atraumatic, no cyanosis or edema  Pulses:   2+ and symmetric all extremities  Skin:   Skin color, texture, turgor normal, no rashes or lesions  Lymph nodes:   Cervical, supraclavicular, and axillary nodes normal  Neurologic:   CNII-XII intact, normal strength, sensation and reflexes throughout   .  Labs:  Lab Results  Component Value Date   WBC 15.9 (  H) 08/29/2019   HGB 9.2 (L) 08/29/2019   HCT 25.9 (L) 08/29/2019   MCV 88.1 08/29/2019   PLT 205 08/29/2019    Lab Results  Component Value Date   CREATININE 0.63 03/28/2016   BUN 9 03/28/2016   NA 137 03/28/2016   K 3.7 03/28/2016   CL 106 03/28/2016   CO2 25 03/28/2016    Lab Results  Component Value Date   ALT 32 11/19/2014   AST 39 11/19/2014   ALKPHOS 50 11/19/2014   BILITOT 0.9 11/19/2014    Lab Results  Component Value Date   TSH 1.540 05/05/2019     Assessment:   No diagnosis found.   Plan:  Blood tests: {blood tests:13147}. Breast self exam technique reviewed and patient encouraged to perform self-exam monthly. Contraception: Nexplanon. Discussed healthy lifestyle modifications. Mammogram  : Not age appropriate Pap smear ordered. COVID vaccination status: Follow up in 1 year for annual exam   Chilton Greathouse, [facility]

## 2022-03-11 NOTE — Patient Instructions (Incomplete)
Breast Self-Awareness Breast self-awareness is knowing how your breasts look and feel. You need to: Check your breasts on a regular basis. Tell your doctor about any changes. Become familiar with the look and feel of your breasts. This can help you catch a breast problem while it is still small and can be treated. You should do breast self-exams even if you have breast implants. What you need: A mirror. A well-lit room. A pillow or other soft object. How to do a breast self-exam Follow these steps to do a breast self-exam: Look for changes  Take off all the clothes above your waist. Stand in front of a mirror in a room with good lighting. Put your hands down at your sides. Compare your breasts in the mirror. Look for any difference between them, such as: A difference in shape. A difference in size. Wrinkles, dips, and bumps in one breast and not the other. Look at each breast for changes in the skin, such as: Redness. Scaly areas. Skin that has gotten thicker. Dimpling. Open sores (ulcers). Look for changes in your nipples, such as: Fluid coming out of a nipple. Fluid around a nipple. Bleeding. Dimpling. Redness. A nipple that looks pushed in (retracted), or that has changed position. Feel for changes Lie on your back. Feel each breast. To do this: Pick a breast to feel. Place a pillow under the shoulder closest to that breast. Put the arm closest to that breast behind your head. Feel the nipple area of that breast using the hand of your other arm. Feel the area with the pads of your three middle fingers by making small circles with your fingers. Use light, medium, and firm pressure. Continue the overlapping circles, moving downward over the breast. Keep making circles with your fingers. Stop when you feel your ribs. Start making circles with your fingers again, this time going upward until you reach your collarbone. Then, make circles outward across your breast and into your  armpit area. Squeeze your nipple. Check for discharge and lumps. Repeat these steps to check your other breast. Sit or stand in the tub or shower. With soapy water on your skin, feel each breast the same way you did when you were lying down. Write down what you find Writing down what you find can help you remember what to tell your doctor. Write down: What is normal for each breast. Any changes you find in each breast. These include: The kind of changes you find. A tender or painful breast. Any lump you find. Write down its size and where it is. When you last had your monthly period (menstrual cycle). General tips If you are breastfeeding, the best time to check your breasts is after you feed your baby or after you use a breast pump. If you get monthly bleeding, the best time to check your breasts is 5-7 days after your monthly cycle ends. With time, you will become comfortable with the self-exam. You will also start to know if there are changes in your breasts. Contact a doctor if: You see a change in the shape or size of your breasts or nipples. You see a change in the skin of your breast or nipples, such as red or scaly skin. You have fluid coming from your nipples that is not normal. You find a new lump or thick area. You have breast pain. You have any concerns about your breast health. Summary Breast self-awareness includes looking for changes in your breasts and feeling for changes   yoga, or listening to music. Journaling. Talking to a trusted person. Spending time with friends and family. Minimize exposure to UV radiation to reduce your risk of skin  cancer. Safety Always wear your seat belt while driving or riding in a vehicle. Do not drive: If you have been drinking alcohol. Do not ride with someone who has been drinking. If you have been using any mind-altering substances or drugs. While texting. When you are tired or distracted. Wear a helmet and other protective equipment during sports activities. If you have firearms in your house, make sure you follow all gun safety procedures. Seek help if you have been physically or sexually abused. What's next? Go to your health care provider once a year for an annual wellness visit. Ask your health care provider how often you should have your eyes and teeth checked. Stay up to date on all vaccines. This information is not intended to replace advice given to you by your health care provider. Make sure you discuss any questions you have with your health care provider. Document Revised: 08/09/2020 Document Reviewed: 08/09/2020 Elsevier Patient Education  2023 [employer]    night with fluoride toothpaste. Floss one time each day. Exercise for at least 30 minutes 5 or more days each week. Do not use any products that contain nicotine or tobacco. These products include cigarettes, chewing tobacco, and vaping devices, such as e-cigarettes. If you need help quitting, ask your health care provider. Do not use drugs. If you are sexually active, practice safe sex. Use a condom or other form of protection to prevent STIs. If you do not wish to become pregnant, use a form of birth control. If you plan to become pregnant, see your health care provider for a prepregnancy visit. Find healthy ways to manage stress, such as: Meditation, yoga, or  listening to music. Journaling. Talking to a trusted person. Spending time with friends and family. Minimize exposure to UV radiation to reduce your risk of skin cancer. Safety Always wear your seat belt while driving or riding in a vehicle. Do not drive: If you have been drinking alcohol. Do not ride with someone who has been drinking. If you have been using any mind-altering substances or drugs. While texting. When you are tired or distracted. Wear a helmet and other protective equipment during sports activities. If you have firearms in your house, make sure you follow all gun safety procedures. Seek help if you have been physically or sexually abused. What's next? Go to your health care provider once a year for an annual wellness visit. Ask your health care provider how often you should have your eyes and teeth checked. Stay up to date on all vaccines. This information is not intended to replace advice given to you by your health care provider. Make sure you discuss any questions you have with your health care provider. Document Revised: 08/09/2020 Document Reviewed: 08/09/2020 Elsevier Patient Education  2023 [employer] Inc[employer]  

## 2022-03-12 ENCOUNTER — Ambulatory Visit: Payer: Medicaid Other | Admitting: Obstetrics and Gynecology

## 2022-09-26 NOTE — Patient Instructions (Signed)
Preventive Care 21-24 Years Old, Female Preventive care refers to lifestyle choices and visits with your health care provider that can promote health and wellness. Preventive care visits are also called wellness exams. What can I expect for my preventive care visit? Counseling During your preventive care visit, your health care provider may ask about your: Medical history, including: Past medical problems. Family medical history. Pregnancy history. Current health, including: Menstrual cycle. Method of birth control. Emotional well-being. Home life and relationship well-being. Sexual activity and sexual health. Lifestyle, including: Alcohol, nicotine or tobacco, and drug use. Access to firearms. Diet, exercise, and sleep habits. Work and work environment. Sunscreen use. Safety issues such as seatbelt and bike helmet use. Physical exam Your health care provider may check your: Height and weight. These may be used to calculate your BMI (body mass index). BMI is a measurement that tells if you are at a healthy weight. Waist circumference. This measures the distance around your waistline. This measurement also tells if you are at a healthy weight and may help predict your risk of certain diseases, such as type 2 diabetes and high blood pressure. Heart rate and blood pressure. Body temperature. Skin for abnormal spots. What immunizations do I need?  Vaccines are usually given at various ages, according to a schedule. Your health care provider will recommend vaccines for you based on your age, medical history, and lifestyle or other factors, such as travel or where you work. What tests do I need? Screening Your health care provider may recommend screening tests for certain conditions. This may include: Pelvic exam and Pap test. Lipid and cholesterol levels. Diabetes screening. This is done by checking your blood sugar (glucose) after you have not eaten for a while (fasting). Hepatitis  B test. Hepatitis C test. HIV (human immunodeficiency virus) test. STI (sexually transmitted infection) testing, if you are at risk. BRCA-related cancer screening. This may be done if you have a family history of breast, ovarian, tubal, or peritoneal cancers. Talk with your health care provider about your test results, treatment options, and if necessary, the need for more tests. Follow these instructions at home: Eating and drinking  Eat a healthy diet that includes fresh fruits and vegetables, whole grains, lean protein, and low-fat dairy products. Take vitamin and mineral supplements as recommended by your health care provider. Do not drink alcohol if: Your health care provider tells you not to drink. You are pregnant, may be pregnant, or are planning to become pregnant. If you drink alcohol: Limit how much you have to 0-1 drink a day. Know how much alcohol is in your drink. In the [place] one drink equals one 12 oz bottle of beer (355 mL), one 5 oz glass of wine (148 mL), or one 1 oz glass of hard liquor (44 mL). Lifestyle Brush your teeth every morning and night with fluoride toothpaste. Floss one time each day. Exercise for at least 30 minutes 5 or more days each week. Do not use any products that contain nicotine or tobacco. These products include cigarettes, chewing tobacco, and vaping devices, such as e-cigarettes. If you need help quitting, ask your health care provider. Do not use drugs. If you are sexually active, practice safe sex. Use a condom or other form of protection to prevent STIs. If you do not wish to become pregnant, use a form of birth control. If you plan to become pregnant, see your health care provider for a prepregnancy visit. Find healthy ways to manage stress, such as: Meditation,   yoga, or listening to music. Journaling. Talking to a trusted person. Spending time with friends and family. Minimize exposure to UV radiation to reduce your risk of skin  cancer. Safety Always wear your seat belt while driving or riding in a vehicle. Do not drive: If you have been drinking alcohol. Do not ride with someone who has been drinking. If you have been using any mind-altering substances or drugs. While texting. When you are tired or distracted. Wear a helmet and other protective equipment during sports activities. If you have firearms in your house, make sure you follow all gun safety procedures. Seek help if you have been physically or sexually abused. What's next? Go to your health care provider once a year for an annual wellness visit. Ask your health care provider how often you should have your eyes and teeth checked. Stay up to date on all vaccines. This information is not intended to replace advice given to you by your health care provider. Make sure you discuss any questions you have with your health care provider. Document Revised: 08/09/2020 Document Reviewed: 08/09/2020 Elsevier Patient Education  2024 [employer] Inc[employer] Breast Self-Awareness Breast self-awareness is knowing how your breasts look and feel. You need to: Check your breasts on a regular basis. Tell your doctor about any changes. Become familiar with the look and feel of your breasts. This can help you catch a breast problem while it is still small and can be treated. You should do breast self-exams even if you have breast implants. What you need: A mirror. A well-lit room. A pillow or other soft object. How to do a breast self-exam Follow these steps to do a breast self-exam: Look for changes  Take off all the clothes above your waist. Stand in front of a mirror in a room with good lighting. Put your hands down at your sides. Compare your breasts in the mirror. Look for any difference between them, such as: A difference in shape. A difference in size. Wrinkles, dips, and bumps in one breast and not the other. Look at each breast for changes in the skin, such  as: Redness. Scaly areas. Skin that has gotten thicker. Dimpling. Open sores (ulcers). Look for changes in your nipples, such as: Fluid coming out of a nipple. Fluid around a nipple. Bleeding. Dimpling. Redness. A nipple that looks pushed in (retracted), or that has changed position. Feel for changes Lie on your back. Feel each breast. To do this: Pick a breast to feel. Place a pillow under the shoulder closest to that breast. Put the arm closest to that breast behind your head. Feel the nipple area of that breast using the hand of your other arm. Feel the area with the pads of your three middle fingers by making small circles with your fingers. Use light, medium, and firm pressure. Continue the overlapping circles, moving downward over the breast. Keep making circles with your fingers. Stop when you feel your ribs. Start making circles with your fingers again, this time going upward until you reach your collarbone. Then, make circles outward across your breast and into your armpit area. Squeeze your nipple. Check for discharge and lumps. Repeat these steps to check your other breast. Sit or stand in the tub or shower. With soapy water on your skin, feel each breast the same way you did when you were lying down. Write down what you find Writing down what you find can help you remember what to tell your doctor. Write down: What is   normal for each breast. Any changes you find in each breast. These include: The kind of changes you find. A tender or painful breast. Any lump you find. Write down its size and where it is. When you last had your monthly period (menstrual cycle). General tips If you are breastfeeding, the best time to check your breasts is after you feed your baby or after you use a breast pump. If you get monthly bleeding, the best time to check your breasts is 5-7 days after your monthly cycle ends. With time, you will become comfortable with the self-exam. You will  also start to know if there are changes in your breasts. Contact a doctor if: You see a change in the shape or size of your breasts or nipples. You see a change in the skin of your breast or nipples, such as red or scaly skin. You have fluid coming from your nipples that is not normal. You find a new lump or thick area. You have breast pain. You have any concerns about your breast health. Summary Breast self-awareness includes looking for changes in your breasts and feeling for changes within your breasts. You should do breast self-awareness in front of a mirror in a well-lit room. If you get monthly periods (menstrual cycles), the best time to check your breasts is 5-7 days after your period ends. Tell your doctor about any changes you see in your breasts. Changes include changes in size, changes on the skin, painful or tender breasts, or fluid from your nipples that is not normal. This information is not intended to replace advice given to you by your health care provider. Make sure you discuss any questions you have with your health care provider. Document Revised: 07/19/2021 Document Reviewed: 12/14/2020 Elsevier Patient Education  2024 [employer] Inc.  

## 2022-09-26 NOTE — Progress Notes (Signed)
GYNECOLOGY ANNUAL PHYSICAL EXAM PROGRESS NOTE  Subjective:    Briana Cruz is a 22 y.o. G37P0101 female who presents for an annual exam. The patient has no complaints today. The patient is sexually active. The patient participates in regular exercise: no. Has the patient ever been transfused or tattooed?: yes. The patient reports that there is not domestic violence in her life.    Menstrual History: Menarche age: 51 No LMP recorded. Patient has had an implant.  Notes cycles are very irregular, skips several months. Sometimes heavy when she has one.      Gynecologic History:  Contraception: Nexplanon History of STI's: Trichomonas, dx 2021 Last Pap: never had one    Upstream - 09/27/22 1333       Pregnancy Intention Screening   Does the patient want to become pregnant in the next year? No    Does the patient's partner want to become pregnant in the next year? No    Would the patient like to discuss contraceptive options today? No      Contraception Wrap Up   Current Method Hormonal Implant    End Method Hormonal Implant    Contraception Counseling Provided No    How was the end contraceptive method provided? N/A            The pregnancy intention screening data noted above was reviewed. Potential methods of contraception were discussed. The patient elected to proceed with Hormonal Implant.   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   Septic shock (HCC) 07/05/2014   Trichomoniasis 11/02/2019    Past Surgical History:  Procedure Laterality Date   CARDIAC SURGERY     EXTRACORPOREAL CIRCULATION     OTHER SURGICAL HISTORY     Facial drain   WISDOM TOOTH EXTRACTION      Family History  Problem Relation Age of Onset   Healthy Mother    Healthy Father    Healthy Brother    Congestive Heart Failure Maternal Grandmother    Kidney disease Maternal Grandmother    Diabetes Maternal Grandmother    Hypertension Maternal Grandmother    Diabetes Maternal Grandfather    Gout Maternal Grandfather     Social History   Socioeconomic History   Marital status: Single    Spouse name: Not on file   Number of children: Not on file   Years of education: Not on file   Highest education level: Not on file  Occupational History   Not on file  Tobacco Use   Smoking status: Never   Smokeless tobacco: Never  Vaping Use   Vaping status: Never Used  Substance and Sexual Activity   Alcohol use: No   Drug use: Not Currently    Types: Marijuana  Sexual activity: Not Currently    Birth control/protection: None, Implant  Other Topics Concern   Not on file  Social History Narrative   Not on file   Social Determinants of Health   Financial Resource Strain: Not on file  Food Insecurity: Not on file  Transportation Needs: Not on file  Physical Activity: Not on file  Stress: Not on file  Social Connections: Not on file  Intimate Partner Violence: Not on file    Current Outpatient Medications on File Prior to Visit  Medication Sig Dispense Refill   docusate sodium (COLACE) 100 MG capsule Take 1 capsule (100 mg total) by mouth 2 (two) times daily as needed. 30 capsule 2   ferrous sulfate (FERROUSUL) 325 (65 FE) MG tablet Take 1 tablet (325 mg total) by mouth 2 (two) times daily with a meal. 60 tablet 1   ibuprofen (ADVIL) 800 MG tablet Take 1 tablet (800 mg total) by mouth every 8 (eight) hours as needed  for mild pain. 30 tablet 1   Prenatal Vit-Fe Fumarate-FA (PRENATAL MULTIVITAMIN) TABS tablet Take 1 tablet by mouth daily at 12 noon.     PROAIR HFA 108 (90 Base) MCG/ACT inhaler INHALE 1 TO 2 PUFFS BY MOUTH INTO THE LUNGS EVERY 4 HOURS AS NEEDED 8.5 g 1   No current facility-administered medications on file prior to visit.    Allergies  Allergen Reactions   Penicillins Other (See Comments)    Rxn to PCN @ 22 yo. Notes "kidneys shut down".      Review of Systems Constitutional: negative for chills, fatigue, fevers and sweats Eyes: negative for irritation, redness and visual disturbance Ears, nose, mouth, throat, and face: negative for hearing loss, nasal congestion, snoring and tinnitus Respiratory: negative for asthma, cough, sputum Cardiovascular: negative for chest pain, dyspnea, exertional chest pressure/discomfort, irregular heart beat, palpitations and syncope Gastrointestinal: negative for abdominal pain, change in bowel habits, nausea and vomiting Genitourinary: negative for abnormal menstrual periods, genital lesions, sexual problems and vaginal discharge, dysuria and urinary incontinence Integument/breast: negative for breast lump, breast tenderness and nipple discharge Hematologic/lymphatic: negative for bleeding and easy bruising Musculoskeletal:negative for back pain and muscle weakness Neurological: negative for dizziness, headaches, vertigo and weakness Endocrine: negative for diabetic symptoms including polydipsia, polyuria and skin dryness Allergic/Immunologic: negative for hay fever and urticaria      Objective:  Blood pressure (!) 102/48, pulse 84, resp. rate 16, height 5\' 3"  (1.6 m), weight 248 lb 3.2 oz (112.6 kg).  Body mass index is 43.97 kg/m.  General Appearance:    Alert, cooperative, no distress, appears stated age, morbid obesity  Head:    Normocephalic, without obvious abnormality, atraumatic  Eyes:    PERRL, conjunctiva/corneas clear, EOM's intact,  both eyes  Ears:    Normal external ear canals, both ears  Nose:   Nares normal, septum midline, mucosa normal, no drainage or sinus tenderness  Throat:   Lips, mucosa, and tongue normal; teeth and gums normal  Neck:   Supple, symmetrical, trachea midline, no adenopathy; thyroid: no enlargement/tenderness/nodules; no carotid bruit or JVD  Back:     Symmetric, no curvature, ROM normal, no CVA tenderness  Lungs:     Clear to auscultation bilaterally, respirations unlabored  Chest Wall:    No tenderness or deformity   Heart:    Regular rate and rhythm, S1 and S2 normal, no murmur, rub or gallop  Breast Exam:    No tenderness, masses, or nipple abnormality  Abdomen:  Soft, non-tender, bowel sounds active all four quadrants, no masses, no organomegaly.    Genitalia:    Pelvic:external genitalia normal, vagina without lesions, or tenderness. Small amount of thin white discharge present, rectovaginal septum  normal. Cervix normal in appearance, no cervical motion tenderness, no adnexal masses or tenderness.  Uterus normal size, shape, mobile, regular contours, nontender.  Rectal:    Normal external sphincter.  No hemorrhoids appreciated. Internal exam not done.   Extremities:   Extremities normal, atraumatic, no cyanosis or edema  Pulses:   2+ and symmetric all extremities  Skin:   Skin color, texture, turgor normal, no rashes or lesions  Lymph nodes:   Cervical, supraclavicular, and axillary nodes normal  Neurologic:   CNII-XII intact, normal strength, sensation and reflexes throughout   .  Labs:  Lab Results  Component Value Date   WBC 15.9 (H) 08/29/2019   HGB 9.2 (L) 08/29/2019   HCT 25.9 (L) 08/29/2019   MCV 88.1 08/29/2019   PLT 205 08/29/2019    Lab Results  Component Value Date   CREATININE 0.63 03/28/2016   BUN 9 03/28/2016   NA 137 03/28/2016   K 3.7 03/28/2016   CL 106 03/28/2016   CO2 25 03/28/2016    Lab Results  Component Value Date   ALT 32 11/19/2014   AST 39  11/19/2014   ALKPHOS 50 11/19/2014   BILITOT 0.9 11/19/2014    Lab Results  Component Value Date   TSH 1.540 05/05/2019     Assessment:   1. Encounter for well woman exam with routine gynecological exam   2. Cervical cancer screening   3. Screening for lipid disorders   4. Class 3 severe obesity with body mass index (BMI) of 40.0 to 44.9 in adult, unspecified obesity type, unspecified whether serious comorbidity present (HCC)   5. Nexplanon in place      Plan:  - Blood tests: CBC with diff and Comprehensive metabolic panel. - Breast self exam technique reviewed and patient encouraged to perform self-exam monthly. - Contraception: Nexplanon. Is due for removal this month. Advised patient to schedule for removal, desires insertion of new Nexplanon at that visit.  - Discussed healthy lifestyle modifications. - Mammogram  : Not age appropriate - Pap smear ordered. - STD screening with GC/Chl performed with pap smear, according to age-based screening guidelines.  - Follow up in 1 year for annual exam   Hildred Laser, MD [facility]

## 2022-09-27 ENCOUNTER — Encounter: Payer: Self-pay | Admitting: Obstetrics and Gynecology

## 2022-09-27 ENCOUNTER — Other Ambulatory Visit (HOSPITAL_COMMUNITY)
Admission: RE | Admit: 2022-09-27 | Discharge: 2022-09-27 | Disposition: A | Discharge status: Home / Self Care | Payer: Medicaid Other | Source: Ambulatory Visit | Attending: Obstetrics and Gynecology | Admitting: Obstetrics and Gynecology

## 2022-09-27 ENCOUNTER — Ambulatory Visit: Payer: Medicaid Other | Admitting: Obstetrics and Gynecology

## 2022-09-27 VITALS — BP 102/48 | HR 84 | Resp 16 | Ht 63.0 in | Wt 248.2 lb

## 2022-09-27 DIAGNOSIS — Z01419 Encounter for gynecological examination (general) (routine) without abnormal findings: Secondary | ICD-10-CM

## 2022-09-27 DIAGNOSIS — Z13220 Encounter for screening for lipoid disorders: Secondary | ICD-10-CM

## 2022-09-27 DIAGNOSIS — Z124 Encounter for screening for malignant neoplasm of cervix: Secondary | ICD-10-CM | POA: Diagnosis present

## 2022-09-27 DIAGNOSIS — Z975 Presence of (intrauterine) contraceptive device: Secondary | ICD-10-CM

## 2022-11-25 ENCOUNTER — Telehealth: Payer: Self-pay | Admitting: Obstetrics and Gynecology

## 2022-11-25 NOTE — Telephone Encounter (Signed)
The patient called back and confirmed scheduling changes. 

## 2022-11-25 NOTE — Telephone Encounter (Signed)
I contacted the patient via phone x2. No answer. I left detailed message advising the patient Dr. Valentino Saxon is in a meeting and we are needing to rescheduled. I offer the same day 10/2 at 10:15 am if the patient is available if not we could offer another date/ time.

## 2022-11-27 ENCOUNTER — Ambulatory Visit: Payer: Medicaid Other | Admitting: Obstetrics and Gynecology

## 2022-11-27 ENCOUNTER — Encounter: Payer: Self-pay | Admitting: Obstetrics and Gynecology

## 2022-11-27 VITALS — BP 89/58 | HR 90 | Resp 16 | Ht 63.0 in | Wt 255.9 lb

## 2022-11-27 DIAGNOSIS — Z3046 Encounter for surveillance of implantable subdermal contraceptive: Secondary | ICD-10-CM

## 2022-11-27 MED ORDER — ETONOGESTREL 68 MG ~~LOC~~ IMPL
68.0000 mg | DRUG_IMPLANT | Freq: Once | SUBCUTANEOUS | Status: AC
  Administered 2022-11-27: 68 mg via SUBCUTANEOUS

## 2022-11-27 NOTE — Progress Notes (Signed)
      GYNECOLOGY OFFICE PROCEDURE NOTE  Briana Cruz is a 22 y.o. G1P0101 here for Nexplanon removal and Nexplanon insertion.  Last pap smear was on 09/27/2022 and was normal.  No other gynecologic concerns.  Nexplanon Removal and Reinsertion Patient identified, informed consent performed, consent signed.   Patient does understand that irregular bleeding is a very common side effect of this medication. She was advised to have backup contraception for one week after replacement of the implant. Pregnancy test in clinic today was negative.  Appropriate time out taken. Nexplanon site identified in left arm.  Area prepped in usual sterile fashon. Three ml of 1% lidocaine was used to anesthetize the area at the distal end of the implant. A small stab incision was made right beside the implant on the distal portion. The Nexplanon rod was grasped using hemostats and removed without difficulty. There was minimal blood loss. There were no complications. Nexplanon removed from packaging, device confirmed in needle, then inserted full length of needle and withdrawn per handbook instructions. Nexplanon was able to palpated in the patient's arm.  There was minimal blood loss. Patient insertion site covered with steri-strip and a pressure bandage to reduce any bruising. The patient tolerated the procedure well and was given post procedure instructions.  She was advised to have backup contraception for one week.      Lot: [id number] Exp: 05/2024   Hildred Laser, MD  [facility]

## 2022-11-27 NOTE — Patient Instructions (Addendum)
Nexplanon Instructions After Insertion  Keep bandage clean and dry for 24 hours  May use ice/Tylenol/Ibuprofen for soreness or pain  If you develop fever, drainage or increased warmth from incision site-contact office immediately  Plan to use a backup method of contraception for 1 week  Etonogestrel Implant What is this medication? ETONOGESTREL (et oh noe JES trel) prevents ovulation and pregnancy. It belongs to a group of medications called contraceptives. This medication is a progestin hormone. This medicine may be used for other purposes; ask your health care provider or pharmacist if you have questions. COMMON BRAND NAME(S): Implanon, Nexplanon What should I tell my care team before I take this medication? They need to know if you have any of these conditions: Abnormal vaginal bleeding Blood clots Blood vessel disease Breast, cervical, endometrial, ovarian, liver, or uterine cancer Diabetes Gallbladder disease Heart disease or recent heart attack High blood pressure High cholesterol or triglycerides Kidney disease Liver disease Migraine headaches Seizures Stroke Tobacco use An unusual or allergic reaction to etonogestrel, other medications, foods, dyes, or preservatives Pregnant or trying to get pregnant Breastfeeding How should I use this medication? This device is inserted just under the skin on the inner side of your upper arm by your care team. Talk to your care team about the use of this medication in children. Special care may be needed. Overdosage: If you think you have taken too much of this medicine contact a poison control center or emergency room at once. NOTE: This medicine is only for you. Do not share this medicine with others. What if I miss a dose? This does not apply. What may interact with this medication? Do not take this medication with any of the following: Amprenavir Fosamprenavir This medication may also interact with the  following: Acitretin Aprepitant Armodafinil Bexarotene Bosentan Carbamazepine Certain antivirals for HIV or hepatitis Certain medications for fungal infections, such as fluconazole, ketoconazole, itraconazole, or voriconazole Cyclosporine Felbamate Griseofulvin Lamotrigine Modafinil Oxcarbazepine Phenobarbital Phenytoin Primidone Rifabutin Rifampin Rifapentine St. John's wort Topiramate This list may not describe all possible interactions. Give your health care provider a list of all the medicines, herbs, non-prescription drugs, or dietary supplements you use. Also tell them if you smoke, drink alcohol, or use illegal drugs. Some items may interact with your medicine. What should I watch for while using this medication? Visit your care team for regular checks on your progress. Using this medication does not protect you or your partner against HIV or other sexually transmitted infections (STIs). You should be able to feel the implant by pressing your fingertips over the skin where it was inserted. Contact your care team if you cannot feel the implant, and use a non-hormonal birth control method (such as condoms) until your care team confirms that the implant is in place. Contact your care team if you think that the implant may have broken or become bent while in your arm. You will receive a user card from your care team after the implant is inserted. The card is a record of the location of the implant in your upper arm and when it should be removed. Keep this card with your health records. What side effects may I notice from receiving this medication? Side effects that you should report to your care team as soon as possible: Allergic reactions--skin rash, itching, hives, swelling of the face, lips, tongue, or throat Blood clot--pain, swelling, or warmth in the leg, shortness of breath, chest pain Gallbladder problems--severe stomach pain, nausea, vomiting, fever Increase in  blood  pressure Liver injury--right upper belly pain, loss of appetite, nausea, light-colored stool, dark yellow or brown urine, yellowing skin or eyes, unusual weakness or fatigue New or worsening migraines or headaches Pain, redness, or irritation at injection site Stroke--sudden numbness or weakness of the face, arm, or leg, trouble speaking, confusion, trouble walking, loss of balance or coordination, dizziness, severe headache, change in vision Unusual vaginal discharge, itching, or odor Worsening mood, feelings of depression Side effects that usually do not require medical attention (report to your care team if they continue or are bothersome): Breast pain or tenderness Dark patches of skin on the face or other sun-exposed areas Irregular menstrual cycles or spotting Nausea Weight gain This list may not describe all possible side effects. Call your doctor for medical advice about side effects. You may report side effects to FDA at 1-800-FDA-1088. Where should I keep my medication? This medication is given in a hospital or clinic and will not be stored at home. NOTE: This sheet is a summary. It may not cover all possible information. If you have questions about this medicine, talk to your doctor, pharmacist, or health care provider.  2024 [employer]vier/Gold Standard (2021-09-18 00:00:00)

## 2022-12-18 ENCOUNTER — Ambulatory Visit: Payer: Medicaid Other | Admitting: Obstetrics and Gynecology

## 2022-12-20 ENCOUNTER — Ambulatory Visit (INDEPENDENT_AMBULATORY_CARE_PROVIDER_SITE_OTHER): Payer: Medicaid Other | Admitting: Obstetrics and Gynecology

## 2022-12-20 ENCOUNTER — Encounter: Payer: Self-pay | Admitting: Obstetrics and Gynecology

## 2022-12-20 VITALS — BP 123/70 | HR 90 | Resp 16 | Ht 63.5 in | Wt 252.4 lb

## 2022-12-20 DIAGNOSIS — Z09 Encounter for follow-up examination after completed treatment for conditions other than malignant neoplasm: Secondary | ICD-10-CM | POA: Diagnosis not present

## 2022-12-20 DIAGNOSIS — Z975 Presence of (intrauterine) contraceptive device: Secondary | ICD-10-CM

## 2022-12-20 DIAGNOSIS — Z3046 Encounter for surveillance of implantable subdermal contraceptive: Secondary | ICD-10-CM

## 2022-12-20 NOTE — Progress Notes (Signed)
    GYNECOLOGY PROGRESS NOTE  Subjective:    Patient ID: Briana Cruz, female    DOB: December 22, 2000, 22 y.o.   MRN: [id number]  HPI  Patient is a 22 y.o. G32P0101 female who presents for follow up on Nexplanon. She had nexplanon removal with reinsertion on 11/27/2022, this is her second Nexplanon.  Patient denies any undesirable side effects or adverse reaction.  The following portions of the patient's history were reviewed and updated as appropriate: allergies, current medications, past family history, past medical history, past social history, past surgical history, and problem list.  Review of Systems Pertinent items noted in HPI and remainder of comprehensive ROS otherwise negative.   Objective:   Blood pressure 123/70, pulse 90, resp. rate 16, height 5' 3.5" (1.613 m), weight 252 lb 6.4 oz (114.5 kg).  Body mass index is 44.01 kg/m. General appearance: alert and no distress Extremities: Left upper extremity palpated with Nexplanon rod palpable, proximal tip approximately 0.5 cm beyond original insertion site.  Site nontender, insertion site well-healed.     Assessment:   1. Nexplanon in place      Plan:   Doing well after Nexplanon insertion.  Patient advised that she can leave the Nexplanon in place for up to 3 years, or can remove sooner if any concerns or desiring to attempt conception.   Return to clinic for any scheduled appointments or for any gynecologic concerns as needed.   Hildred Laser, MD [facility]
